# Patient Record
Sex: Female | Born: 1965 | Hispanic: No | Smoking: Former smoker
Health system: Southern US, Community
[De-identification: ages and names within clinical notes are randomized; demographics above are authoritative.]

## PROBLEM LIST (undated history)

## (undated) DIAGNOSIS — F419 Anxiety disorder, unspecified: Secondary | ICD-10-CM

## (undated) DIAGNOSIS — F32A Depression, unspecified: Secondary | ICD-10-CM

## (undated) DIAGNOSIS — F329 Major depressive disorder, single episode, unspecified: Secondary | ICD-10-CM

## (undated) HISTORY — PX: ABDOMINAL SURGERY: SHX537

## (undated) HISTORY — PX: OTHER SURGICAL HISTORY: SHX169

---

## 2009-04-15 ENCOUNTER — Ambulatory Visit (HOSPITAL_COMMUNITY): Admission: RE | Admit: 2009-04-15 | Discharge: 2009-04-15 | Payer: Self-pay | Admitting: Family Medicine

## 2009-11-19 ENCOUNTER — Emergency Department (HOSPITAL_COMMUNITY): Admission: EM | Admit: 2009-11-19 | Discharge: 2009-11-19 | Payer: Self-pay | Admitting: Emergency Medicine

## 2010-11-16 ENCOUNTER — Emergency Department (HOSPITAL_COMMUNITY)
Admission: EM | Admit: 2010-11-16 | Discharge: 2010-11-16 | Disposition: A | Discharge status: Home / Self Care | Payer: Self-pay | Attending: Emergency Medicine | Admitting: Emergency Medicine

## 2010-11-16 DIAGNOSIS — R21 Rash and other nonspecific skin eruption: Secondary | ICD-10-CM | POA: Insufficient documentation

## 2010-11-16 DIAGNOSIS — B850 Pediculosis due to Pediculus humanus capitis: Secondary | ICD-10-CM | POA: Insufficient documentation

## 2010-11-16 HISTORY — DX: Major depressive disorder, single episode, unspecified: F32.9

## 2010-11-16 HISTORY — DX: Depression, unspecified: F32.A

## 2010-11-16 MED ORDER — PREDNISONE 10 MG PO TABS: 10.0000 mg | ORAL_TABLET | Freq: Every day | ORAL | Status: AC

## 2010-11-16 MED ORDER — PERMETHRIN 1 % EX LIQD: Freq: Once | CUTANEOUS | Status: AC

## 2010-11-16 NOTE — ED Notes (Signed)
Pt presents with lice infection and red itching skin. Pt states skin has been going on for "years". Pt states she has performed multiple NIX treatments.

## 2010-11-16 NOTE — ED Notes (Signed)
Pt states youngest daughter began itching a few days ago, has bought several boxes of nix without success.  Mom states followed instructions on box .

## 2010-11-16 NOTE — ED Provider Notes (Signed)
History     CSN: [id number] Arrival date & time: 11/16/2010  2:29 PM  Chief Complaint  Patient presents with  . Rash  . Head Lice   HPI Gina Kline is a 45 y.o. female who presents to the ED with head lice. Onset of symptoms 3 weeks ago when her daughter had a case of lice.   Past Medical History  Diagnosis Date  . Depression     Past Surgical History  Procedure Date  . Tummy tuck     History reviewed. No pertinent family history.  History  Substance Use Topics  . Smoking status: Current Everyday Smoker -- 0.5 packs/day  . Smokeless tobacco: Not on file  . Alcohol Use: Yes    OB History    Grav Para Term Preterm Abortions TAB SAB Ect Mult Living                  Review of Systems  Skin: Positive for rash.  All other systems reviewed and are negative.    Physical Exam  BP 120/77  Pulse 63  Temp 98.5 F (36.9 C)  Resp 20  SpO2 96%  LMP 10/28/2010  Physical Exam  Nursing note and vitals reviewed. Constitutional: She is oriented to person, place, and time. She appears well-developed and well-nourished. No distress.  HENT:  Head: Normocephalic. Hair is abnormal.       Nits noted in hair.  Eyes: EOM are normal.  Neck: Neck supple.  Pulmonary/Chest: Effort normal.  Abdominal: There is no tenderness.  Musculoskeletal: Normal range of motion.  Neurological: She is alert and oriented to person, place, and time. No cranial nerve deficit.  Skin: Skin is warm and dry.    ED Course  Procedures  MDM Assessment: Head lice  Plan:   Nix shampoo   Follow up with PCP    In addition the patient states she has a rash on her arms after working out in the yard the past few days.  The are raised linear areas bilateral forearms. Will treat for contact dermitis. The patient sates that the last time she had this she was treated with a steroid dose pack and it went away quickly. Will give dose pak.   [place], NP 11/16/10 [address] NP 11/16/10  1644

## 2010-11-19 NOTE — ED Provider Notes (Signed)
Medical screening examination/treatment/procedure(s) were performed by non-physician practitioner and as supervising physician I was immediately available for consultation/collaboration.  Nicholes Stairs, MD 11/19/10 [phone]

## 2011-04-13 ENCOUNTER — Encounter (HOSPITAL_COMMUNITY): Payer: Self-pay

## 2011-04-13 ENCOUNTER — Other Ambulatory Visit: Payer: Self-pay

## 2011-04-13 ENCOUNTER — Emergency Department (HOSPITAL_COMMUNITY)
Admission: EM | Admit: 2011-04-13 | Discharge: 2011-04-13 | Disposition: A | Discharge status: Home / Self Care | Payer: Self-pay | Attending: Emergency Medicine | Admitting: Emergency Medicine

## 2011-04-13 DIAGNOSIS — R5383 Other fatigue: Secondary | ICD-10-CM

## 2011-04-13 DIAGNOSIS — R112 Nausea with vomiting, unspecified: Secondary | ICD-10-CM | POA: Insufficient documentation

## 2011-04-13 DIAGNOSIS — R5381 Other malaise: Secondary | ICD-10-CM | POA: Insufficient documentation

## 2011-04-13 DIAGNOSIS — F341 Dysthymic disorder: Secondary | ICD-10-CM | POA: Insufficient documentation

## 2011-04-13 HISTORY — DX: Anxiety disorder, unspecified: F41.9

## 2011-04-13 LAB — URINE MICROSCOPIC-ADD ON

## 2011-04-13 LAB — CBC
MCH: 32.9 pg (ref 26.0–34.0)
MCHC: 33.3 g/dL (ref 30.0–36.0)
Platelets: 306 10*3/uL (ref 150–400)
WBC: 6.4 10*3/uL (ref 4.0–10.5)

## 2011-04-13 LAB — URINALYSIS, ROUTINE W REFLEX MICROSCOPIC
Leukocytes, UA: NEGATIVE
Nitrite: NEGATIVE
Specific Gravity, Urine: >1.03 — ABNORMAL HIGH (ref 1.005–1.030)
Urobilinogen, UA: 0.2 mg/dL (ref 0.0–1.0)

## 2011-04-13 LAB — BASIC METABOLIC PANEL
BUN: 11 mg/dL (ref 6–23)
CO2: 28 mEq/L (ref 19–32)
Chloride: 106 mEq/L (ref 96–112)
Potassium: 3.8 mEq/L (ref 3.5–5.1)

## 2011-04-13 LAB — POCT PREGNANCY, URINE: Preg Test, Ur: NEGATIVE

## 2011-04-13 LAB — DIFFERENTIAL
Eosinophils Absolute: 0.4 10*3/uL (ref 0.0–0.7)
Lymphocytes Relative: 25 % (ref 12–46)
Monocytes Relative: 8 % (ref 3–12)
Neutro Abs: 3.8 10*3/uL (ref 1.7–7.7)
Neutrophils Relative %: 60 % (ref 43–77)

## 2011-04-13 LAB — PREGNANCY, URINE: Preg Test, Ur: NEGATIVE

## 2011-04-13 NOTE — ED Notes (Signed)
General weakness for 3 weeks, has not been seen by md.

## 2011-04-13 NOTE — ED Notes (Signed)
Pt stated she has been having n/v,  "knots swollen to right ear", has now gone away.

## 2011-04-13 NOTE — ED Provider Notes (Signed)
History   This chart was scribed for Joya Gaskins, MD by Sofie Rower. The patient was seen in room APA03/APA03 and the patient's care was started at 6:29PM.    CSN: [id number]  Arrival date & time 04/13/11  1530   First MD Initiated Contact with Patient 04/13/11 1803      Chief Complaint  Patient presents with  . Weakness    x 3 weeks    Patient is a 46 y.o. female presenting with weakness. The history is provided by the patient. No language interpreter was used.  Weakness The primary symptoms include headaches, nausea and vomiting. Primary symptoms do not include loss of consciousness or visual change. The symptoms began more than 1 week ago. Episode duration: constant. The symptoms are unchanged.  The headache began more than 2 days ago. The headache developed gradually. Headache is a new problem. The headache is present frequently. The headache is associated with weakness. The headache is not associated with visual change.  Nausea began more than 1 week ago.  The vomiting began more than 2 days ago.  Additional symptoms include weakness and lower back pain. Additional symptoms do not include pain (chest pain.).    Pt denies dysuria. Pt obtains prescriptions from [facility]. Pt reports mild headache, none at this time No focal weakness Reports fatigue/decreased energy No falls No visual change No cp/sob at this time She has had some vomiting She is currently on menstrual cycle  Past Medical History  Diagnosis Date  . Depression   . Anxiety   . Depression     Past Surgical History  Procedure Date  . Tummy tuck   . Abdominal surgery     No family history on file.  History  Substance Use Topics  . Smoking status: Current Everyday Smoker -- 0.5 packs/day    Types: Cigarettes  . Smokeless tobacco: Not on file  . Alcohol Use: Yes     occ    OB History    Grav Para Term Preterm Abortions TAB SAB Ect Mult Living                  Review of Systems    Gastrointestinal: Positive for nausea and vomiting.  Neurological: Positive for weakness and headaches. Negative for loss of consciousness.  All other systems reviewed and are negative.    Allergies  Review of patient's allergies indicates no known allergies.  Home Medications   Current Outpatient Rx  Name Route Sig Dispense Refill  . CLONAZEPAM 1 MG PO TABS Oral Take 0.5 mg by mouth 2 (two) times daily. Patient also takes 2 tablets by mouth at bedtime    . SERTRALINE HCL 100 MG PO TABS Oral Take 100 mg by mouth 2 (two) times daily.        BP 129/82  Pulse 81  Temp(Src) 97.2 F (36.2 C) (Oral)  Resp 20  Ht 5\' 5"  (1.651 m)  Wt 130 lb (58.968 kg)  BMI 21.63 kg/m2  SpO2 99%  LMP 04/13/2011  Physical Exam  CONSTITUTIONAL: Well developed/well nourished HEAD AND FACE: Normocephalic/atraumatic EYES: EOMI/PERRL, conjunctiva pink ENMT: Mucous membranes moist NECK: supple no meningeal signs SPINE:entire spine nontender CV: S1/S2 noted, no murmurs/rubs/gallops noted LUNGS: Lungs are clear to auscultation bilaterally, no apparent distress ABDOMEN: soft, nontender, no rebound or guarding GU:no cva tenderness NEURO: Pt is awake/alert, moves all extremitiesx4 EXTREMITIES: pulses normal, full ROM SKIN: warm, color normal PSYCH: no abnormalities of mood noted   ED Course  Procedures  DIAGNOSTIC STUDIES: Oxygen Saturation is 99% on room air, normal by my interpretation.    COORDINATION OF CARE:   Results for orders placed during the hospital encounter of 04/13/11  CBC      Component Value Range   WBC 6.4  4.0 - 10.5 (K/uL)   RBC 4.35  3.87 - 5.11 (MIL/uL)   Hemoglobin 14.3  12.0 - 15.0 (g/dL)   HCT 45.4  09.8 - 11.9 (%)   MCV 98.9  78.0 - 100.0 (fL)   MCH 32.9  26.0 - 34.0 (pg)   MCHC 33.3  30.0 - 36.0 (g/dL)   RDW 14.7  82.9 - 56.2 (%)   Platelets 306  150 - 400 (K/uL)  DIFFERENTIAL      Component Value Range   Neutrophils Relative 60  43 - 77 (%)   Neutro Abs  3.8  1.7 - 7.7 (K/uL)   Lymphocytes Relative 25  12 - 46 (%)   Lymphs Abs 1.6  0.7 - 4.0 (K/uL)   Monocytes Relative 8  3 - 12 (%)   Monocytes Absolute 0.5  0.1 - 1.0 (K/uL)   Eosinophils Relative 6 (*) 0 - 5 (%)   Eosinophils Absolute 0.4  0.0 - 0.7 (K/uL)   Basophils Relative 1  0 - 1 (%)   Basophils Absolute 0.1  0.0 - 0.1 (K/uL)  BASIC METABOLIC PANEL      Component Value Range   Sodium 143  135 - 145 (mEq/L)   Potassium 3.8  3.5 - 5.1 (mEq/L)   Chloride 106  96 - 112 (mEq/L)   CO2 28  19 - 32 (mEq/L)   Glucose, Bld 83  70 - 99 (mg/dL)   BUN 11  6 - 23 (mg/dL)   Creatinine, Ser 1.30  0.50 - 1.10 (mg/dL)   Calcium 9.8  8.4 - 86.5 (mg/dL)   GFR calc non Af Amer >90  >90 (mL/min)   GFR calc Af Amer >90  >90 (mL/min)  PREGNANCY, URINE      Component Value Range   Preg Test, Ur NEGATIVE  NEGATIVE   URINALYSIS, ROUTINE W REFLEX MICROSCOPIC      Component Value Range   Color, Urine YELLOW  YELLOW    APPearance CLEAR  CLEAR    Specific Gravity, Urine >1.030 (*) 1.005 - 1.030    pH 6.0  5.0 - 8.0    Glucose, UA NEGATIVE  NEGATIVE (mg/dL)   Hgb urine dipstick LARGE (*) NEGATIVE    Bilirubin Urine NEGATIVE  NEGATIVE    Ketones, ur NEGATIVE  NEGATIVE (mg/dL)   Protein, ur TRACE (*) NEGATIVE (mg/dL)   Urobilinogen, UA 0.2  0.0 - 1.0 (mg/dL)   Nitrite NEGATIVE  NEGATIVE    Leukocytes, UA NEGATIVE  NEGATIVE   POCT PREGNANCY, URINE      Component Value Range   Preg Test, Ur NEGATIVE  NEGATIVE   URINE MICROSCOPIC-ADD ON      Component Value Range   Squamous Epithelial / LPF MANY (*) RARE    WBC, UA 0-2  <3 (WBC/hpf)   RBC / HPF 7-10  <3 (RBC/hpf)   Bacteria, UA FEW (*) RARE      6:31PM- EDP at bedside discusses treatment plan concerning blood work, EKG. Pt well appearing, reports fatigue but no other complaints at this time (no cp/sob/abd pain/headache)  The patient appears reasonably screened and/or stabilized for discharge and I doubt any other medical condition or other [facility]  requiring further screening, evaluation, or treatment  in the ED at this time prior to discharge.   MDM  Nursing notes reviewed and considered in documentation All labs/vitals reviewed and considered    Date: 04/13/2011  Rate: 66  Rhythm: normal sinus rhythm  QRS Axis: normal  Intervals: normal  ST/T Wave abnormalities: nonspecific ST changes  Conduction Disutrbances:none  Narrative Interpretation:   Old EKG Reviewed: none available    I personally performed the services described in this documentation, which was scribed in my presence. The recorded information has been reviewed and considered.        Joya Gaskins, MD 04/13/11 [phone]

## 2011-07-04 ENCOUNTER — Emergency Department (HOSPITAL_COMMUNITY)
Admission: EM | Admit: 2011-07-04 | Discharge: 2011-07-04 | Disposition: A | Discharge status: Home / Self Care | Payer: Self-pay | Attending: Emergency Medicine | Admitting: Emergency Medicine

## 2011-07-04 ENCOUNTER — Encounter (HOSPITAL_COMMUNITY): Payer: Self-pay | Admitting: *Deleted

## 2011-07-04 DIAGNOSIS — M545 Low back pain, unspecified: Secondary | ICD-10-CM | POA: Insufficient documentation

## 2011-07-04 DIAGNOSIS — M5430 Sciatica, unspecified side: Secondary | ICD-10-CM | POA: Insufficient documentation

## 2011-07-04 DIAGNOSIS — M79609 Pain in unspecified limb: Secondary | ICD-10-CM | POA: Insufficient documentation

## 2011-07-04 DIAGNOSIS — Z9889 Other specified postprocedural states: Secondary | ICD-10-CM | POA: Insufficient documentation

## 2011-07-04 DIAGNOSIS — F17200 Nicotine dependence, unspecified, uncomplicated: Secondary | ICD-10-CM | POA: Insufficient documentation

## 2011-07-04 DIAGNOSIS — Z79899 Other long term (current) drug therapy: Secondary | ICD-10-CM | POA: Insufficient documentation

## 2011-07-04 DIAGNOSIS — F341 Dysthymic disorder: Secondary | ICD-10-CM | POA: Insufficient documentation

## 2011-07-04 DIAGNOSIS — M5432 Sciatica, left side: Secondary | ICD-10-CM

## 2011-07-04 MED ORDER — PREDNISONE 50 MG PO TABS: 50.0000 mg | ORAL_TABLET | Freq: Every day | ORAL | Status: AC

## 2011-07-04 MED ORDER — OXYCODONE-ACETAMINOPHEN 5-325 MG PO TABS: ORAL_TABLET | ORAL | Status: DC

## 2011-07-04 MED ORDER — PREDNISONE 20 MG PO TABS
60.0000 mg | ORAL_TABLET | Freq: Once | ORAL | Status: AC
  Administered 2011-07-04: 60 mg via ORAL
  Filled 2011-07-04: qty 3

## 2011-07-04 MED ORDER — OXYCODONE-ACETAMINOPHEN 5-325 MG PO TABS
1.0000 | ORAL_TABLET | Freq: Once | ORAL | Status: AC
  Administered 2011-07-04: 1 via ORAL
  Filled 2011-07-04: qty 1

## 2011-07-04 NOTE — Discharge Instructions (Signed)
Cryotherapy Cryotherapy means treatment with cold. Ice or gel packs can be used to reduce both pain and swelling. Ice is the most helpful within the first 24 to 48 hours after an injury or flareup from overusing a muscle or joint. Sprains, strains, spasms, burning pain, shooting pain, and aches can all be eased with ice. Ice can also be used when recovering from surgery. Ice is effective, has very few side effects, and is safe for most people to use. PRECAUTIONS  Ice is not a safe treatment option for people with:  Raynaud's phenomenon. This is a condition affecting small blood vessels in the extremities. Exposure to cold may cause your problems to return.   Cold hypersensitivity. There are many forms of cold hypersensitivity, including:   Cold urticaria. Red, itchy hives appear on the skin when the tissues begin to warm after being iced.   Cold erythema. This is a red, itchy rash caused by exposure to cold.   Cold hemoglobinuria. Red blood cells break down when the tissues begin to warm after being iced. The hemoglobin that carry oxygen are passed into the urine because they cannot combine with blood proteins fast enough.   Numbness or altered sensitivity in the area being iced.  If you have any of the following conditions, do not use ice until you have discussed cryotherapy with your caregiver:  Heart conditions, such as arrhythmia, angina, or chronic heart disease.   High blood pressure.   Healing wounds or open skin in the area being iced.   Current infections.   Rheumatoid arthritis.   Poor circulation.   Diabetes.  Ice slows the blood flow in the region it is applied. This is beneficial when trying to stop inflamed tissues from spreading irritating chemicals to surrounding tissues. However, if you expose your skin to cold temperatures for too long or without the proper protection, you can damage your skin or nerves. Watch for signs of skin damage due to cold. HOME CARE  INSTRUCTIONS Follow these tips to use ice and cold packs safely.  Place a dry or damp towel between the ice and skin. A damp towel will cool the skin more quickly, so you may need to shorten the time that the ice is used.   For a more rapid response, add gentle compression to the ice.   Ice for no more than 10 to 20 minutes at a time. The bonier the area you are icing, the less time it will take to get the benefits of ice.   Check your skin after 5 minutes to make sure there are no signs of a poor response to cold or skin damage.   Rest 20 minutes or more in between uses.   Once your skin is numb, you can end your treatment. You can test numbness by very lightly touching your skin. The touch should be so light that you do not see the skin dimple from the pressure of your fingertip. When using ice, most people will feel these normal sensations in this order: cold, burning, aching, and numbness.   Do not use ice on someone who cannot communicate their responses to pain, such as small children or people with dementia.  HOW TO MAKE AN ICE PACK Ice packs are the most common way to use ice therapy. Other methods include ice massage, ice baths, and cryo-sprays. Muscle creams that cause a cold, tingly feeling do not offer the same benefits that ice offers and should not be used as a substitute  unless recommended by your caregiver. To make an ice pack, do one of the following:  Place crushed ice or a bag of frozen vegetables in a sealable plastic bag. Squeeze out the excess air. Place this bag inside another plastic bag. Slide the bag into a pillowcase or place a damp towel between your skin and the bag.   Mix 3 parts water with 1 part rubbing alcohol. Freeze the mixture in a sealable plastic bag. When you remove the mixture from the freezer, it will be slushy. Squeeze out the excess air. Place this bag inside another plastic bag. Slide the bag into a pillowcase or place a damp towel between your skin  and the bag.  SEEK MEDICAL CARE IF:  You develop white spots on your skin. This may give the skin a blotchy (mottled) appearance.   Your skin turns blue or pale.   Your skin becomes waxy or hard.   Your swelling gets worse.  MAKE SURE YOU:   Understand these instructions.   Will watch your condition.   Will get help right away if you are not doing well or get worse.  Document Released: 10/20/2010 Document Revised: 02/12/2011 Document Reviewed: 10/20/2010 [facility] Patient Information 2012 [place].Sciatica Sciatica is a weakness and/or changes in sensation (tingling, jolts, hot and cold, numbness) along the path the sciatic nerve travels. Irritation or damage to lumbar nerve roots is often also referred to as lumbar radiculopathy.  Lumbar radiculopathy (Sciatica) is the most common form of this problem. Radiculopathy can occur in any of the nerves coming out of the spinal cord. The problems caused depend on which nerves are involved. The sciatic nerve is the large nerve supplying the branches of nerves going from the hip to the toes. It often causes a numbness or weakness in the skin and/or muscles that the sciatic nerve serves. It also may cause symptoms (problems) of pain, burning, tingling, or electric shock-like feelings in the path of this nerve. This usually comes from injury to the fibers that make up the sciatic nerve. Some of these symptoms are low back pain and/or unpleasant feelings in the following areas:  From the mid-buttock down the back of the leg to the back of the knee.   And/or the outside of the calf and top of the foot.   And/or behind the inner ankle to the sole of the foot.  CAUSES   Herniated or slipped disc. Discs are the little cushions between the bones in the back.   Pressure by the piriformis muscle in the buttock on the sciatic nerve (Piriformis Syndrome).   Misalignment of the bones in the lower back and buttocks (Sacroiliac Joint Derangement).     Narrowing of the spinal canal that puts pressure on or pinches the fibers that make up the sciatic nerve.   A slipped vertebra that is out of line with those above or beneath it.   Abnormality of the nervous system itself so that nerve fibers do not transmit signals properly, especially to feet and calves (neuropathy).   Tumor (this is rare).  Your caregiver can usually determine the cause of your sciatica and begin the treatment most likely to help you. TREATMENT  Taking over-the-counter painkillers, physical therapy, rest, exercise, spinal manipulation, and injections of anesthetics and/or steroids may be used. Surgery, acupuncture, and Yoga can also be effective. Mind over matter techniques, mental imagery, and changing factors such as your bed, chair, desk height, posture, and activities are other treatments that may be  helpful. You and your caregiver can help determine what is best for you. With proper diagnosis, the cause of most sciatica can be identified and removed. Communication and cooperation between your caregiver and you is essential. If you are not successful immediately, do not be discouraged. With time, a proper treatment can be found that will make you comfortable. HOME CARE INSTRUCTIONS   If the pain is coming from a problem in the back, applying ice to that area for 15 to 20 minutes, 3 to 4 times per day while awake, may be helpful. Put the ice in a plastic bag. Place a towel between the bag of ice and your skin.   You may exercise or perform your usual activities if these do not aggravate your pain, or as suggested by your caregiver.   Only take over-the-counter or prescription medicines for pain, discomfort, or fever as directed by your caregiver.   If your caregiver has given you a follow-up appointment, it is very important to keep that appointment. Not keeping the appointment could result in a chronic or permanent injury, pain, and disability. If there is any problem  keeping the appointment, you must call back to this facility for assistance.  SEEK IMMEDIATE MEDICAL CARE IF:   You experience loss of control of bowel or bladder.   You have increasing weakness in the trunk, buttocks, or legs.   There is numbness in any areas from the hip down to the toes.   You have difficulty walking or keeping your balance.   You have any of the above, with fever or forceful vomiting.  Document Released: 02/17/2001 Document Revised: 02/12/2011 Document Reviewed: 10/07/2007 [facility] Patient Information 2012 [place], [place].   Take the meds as directed.  Apply ice several times daily to lower back.  Follow up with your MD at the health dept.  If symptoms don't improve you may need to see an orthopedist or neurosurgeon.

## 2011-07-04 NOTE — ED Notes (Signed)
Left sided back pain shooting down left leg x 2 days.

## 2011-07-04 NOTE — ED Notes (Signed)
Pt a/ox4. Resp even and unlabored. NAD at this time. D/C instructions reviewed with pt. Pt verbalized understanding. Pt ambulated to lobby with steady gate. Pt verbalized understanding regarding no driving for 6 hrs after taking pain medication. Pt states she is having her daughter drive her home. Daughter is with pt.

## 2011-07-04 NOTE — ED Provider Notes (Signed)
History     CSN: [id number]  Arrival date & time 07/04/11  1815   First MD Initiated Contact with Patient 07/04/11 1953      Chief Complaint  Patient presents with  . Back Pain    (Consider location/radiation/quality/duration/timing/severity/associated sxs/prior treatment) HPI Comments: Pt has had intermittent episodes of L sciatica for the past 15 years.  It flared up 3 days ago when she tripped and fell on her R knee.  Radiates to L foot.  Patient is a 46 y.o. female presenting with back pain. The history is provided by the patient. No language interpreter was used.  Back Pain  This is a recurrent problem. The problem occurs constantly. The problem has not changed since onset.The pain is present in the lumbar spine. The quality of the pain is described as shooting. The pain radiates to the left foot. The pain is severe. The symptoms are aggravated by bending and twisting. The pain is the same all the time. Pertinent negatives include no fever, no numbness and no weakness. She has tried nothing for the symptoms.    Past Medical History  Diagnosis Date  . Depression   . Anxiety   . Depression     Past Surgical History  Procedure Date  . Tummy tuck   . Abdominal surgery     No family history on file.  History  Substance Use Topics  . Smoking status: Current Everyday Smoker -- 0.5 packs/day    Types: Cigarettes  . Smokeless tobacco: Not on file  . Alcohol Use: Yes     occ    OB History    Grav Para Term Preterm Abortions TAB SAB Ect Mult Living                  Review of Systems  Constitutional: Negative for fever and chills.  Musculoskeletal: Positive for back pain.  Neurological: Negative for weakness and numbness.  All other systems reviewed and are negative.    Allergies  Review of patient's allergies indicates no known allergies.  Home Medications   Current Outpatient Rx  Name Route Sig Dispense Refill  . CLONAZEPAM 1 MG PO TABS Oral Take 0.5 mg  by mouth 2 (two) times daily. Patient also takes 2 tablets by mouth at bedtime    . OXYCODONE-ACETAMINOPHEN 5-325 MG PO TABS  One tab po q 4-6 hrs prn pain 20 tablet 0  . PREDNISONE 50 MG PO TABS Oral Take 1 tablet (50 mg total) by mouth daily. 6 tablet 0  . SERTRALINE HCL 100 MG PO TABS Oral Take 100 mg by mouth 2 (two) times daily.        BP 117/75  Pulse 80  Temp(Src) 98 F (36.7 C) (Oral)  Resp 16  Ht 5\' 5"  (1.651 m)  Wt 132 lb (59.875 kg)  BMI 21.97 kg/m2  SpO2 98%  LMP 06/27/2011  Physical Exam  Nursing note and vitals reviewed. Constitutional: She is oriented to person, place, and time. She appears well-developed and well-nourished. No distress.  HENT:  Head: Normocephalic and atraumatic.  Eyes: EOM are normal.  Neck: Normal range of motion.  Cardiovascular: Normal rate, regular rhythm and normal heart sounds.   Pulmonary/Chest: Effort normal and breath sounds normal.  Abdominal: Soft. She exhibits no distension. There is no tenderness.  Musculoskeletal: Normal range of motion.       Back:       Localizes pain to L piriformis muscle area and radiation to L heel.  Good B foot dorsiflexion strength.    Neurological: She is alert and oriented to person, place, and time. She displays normal reflexes. Coordination normal.  Skin: Skin is warm and dry.  Psychiatric: She has a normal mood and affect. Judgment normal.    ED Course  Procedures (including critical care time)  Labs Reviewed - No data to display No results found.   1. Left sided sciatica       MDM  rx- prednisone 50 mg, 6 rx-percocet, 20 Ice F/u with your MD.        Worthy Rancher, PA 07/04/11 2014  Worthy Rancher, PA 07/05/11 [phone]

## 2011-07-05 NOTE — ED Provider Notes (Signed)
Medical screening examination/treatment/procedure(s) were performed by non-physician practitioner and as supervising physician I was immediately available for consultation/collaboration.  Nicoletta Dress. Colon Branch, MD 07/05/11 1529

## 2011-12-11 ENCOUNTER — Encounter (HOSPITAL_COMMUNITY): Payer: Self-pay | Admitting: *Deleted

## 2011-12-11 ENCOUNTER — Emergency Department (HOSPITAL_COMMUNITY)
Admission: EM | Admit: 2011-12-11 | Discharge: 2011-12-11 | Disposition: A | Discharge status: Home / Self Care | Payer: Self-pay | Attending: Emergency Medicine | Admitting: Emergency Medicine

## 2011-12-11 DIAGNOSIS — M549 Dorsalgia, unspecified: Secondary | ICD-10-CM

## 2011-12-11 DIAGNOSIS — M5416 Radiculopathy, lumbar region: Secondary | ICD-10-CM

## 2011-12-11 DIAGNOSIS — F17200 Nicotine dependence, unspecified, uncomplicated: Secondary | ICD-10-CM | POA: Insufficient documentation

## 2011-12-11 DIAGNOSIS — M545 Low back pain, unspecified: Secondary | ICD-10-CM | POA: Insufficient documentation

## 2011-12-11 DIAGNOSIS — IMO0002 Reserved for concepts with insufficient information to code with codable children: Secondary | ICD-10-CM | POA: Insufficient documentation

## 2011-12-11 DIAGNOSIS — F329 Major depressive disorder, single episode, unspecified: Secondary | ICD-10-CM | POA: Insufficient documentation

## 2011-12-11 DIAGNOSIS — F411 Generalized anxiety disorder: Secondary | ICD-10-CM | POA: Insufficient documentation

## 2011-12-11 DIAGNOSIS — Z79899 Other long term (current) drug therapy: Secondary | ICD-10-CM | POA: Insufficient documentation

## 2011-12-11 DIAGNOSIS — F3289 Other specified depressive episodes: Secondary | ICD-10-CM | POA: Insufficient documentation

## 2011-12-11 MED ORDER — HYDROCODONE-ACETAMINOPHEN 5-325 MG PO TABS: 1.0000 | ORAL_TABLET | Freq: Four times a day (QID) | ORAL | Status: DC | PRN

## 2011-12-11 MED ORDER — CYCLOBENZAPRINE HCL 10 MG PO TABS
10.0000 mg | ORAL_TABLET | Freq: Once | ORAL | Status: AC
  Administered 2011-12-11: 10 mg via ORAL
  Filled 2011-12-11: qty 1

## 2011-12-11 MED ORDER — PREDNISONE 20 MG PO TABS
60.0000 mg | ORAL_TABLET | Freq: Once | ORAL | Status: AC
  Administered 2011-12-11: 60 mg via ORAL
  Filled 2011-12-11: qty 3

## 2011-12-11 MED ORDER — PREDNISONE 20 MG PO TABS: 40.0000 mg | ORAL_TABLET | Freq: Every day | ORAL | Status: DC

## 2011-12-11 MED ORDER — OXYCODONE-ACETAMINOPHEN 5-325 MG PO TABS: ORAL_TABLET | ORAL | Status: DC

## 2011-12-11 MED ORDER — CYCLOBENZAPRINE HCL 10 MG PO TABS: ORAL_TABLET | ORAL | Status: DC

## 2011-12-11 NOTE — ED Provider Notes (Addendum)
History     CSN: [id number]  Arrival date & time 12/11/11  1846   First MD Initiated Contact with Patient 12/11/11 1924      Chief Complaint  Patient presents with  . Back Pain    (Consider location/radiation/quality/duration/timing/severity/associated sxs/prior treatment) HPI Comments: Pt c/o L sciatic nerve pain ~ 2 days.  No known injury  The history is provided by the patient. No language interpreter was used.    Past Medical History  Diagnosis Date  . Depression   . Anxiety   . Depression     Past Surgical History  Procedure Date  . Tummy tuck   . Abdominal surgery     History reviewed. No pertinent family history.  History  Substance Use Topics  . Smoking status: Current Every Day Smoker -- 0.5 packs/day    Types: Cigarettes  . Smokeless tobacco: Not on file  . Alcohol Use: Yes     occ    OB History    Grav Para Term Preterm Abortions TAB SAB Ect Mult Living                  Review of Systems  Musculoskeletal: Positive for back pain.  Neurological: Negative for weakness and numbness.  All other systems reviewed and are negative.    Allergies  Review of patient's allergies indicates no known allergies.  Home Medications   Current Outpatient Rx  Name Route Sig Dispense Refill  . CLONAZEPAM 1 MG PO TABS Oral Take 0.5 mg by mouth 2 (two) times daily. Patient also takes 2 tablets by mouth at bedtime    . CYCLOBENZAPRINE HCL 10 MG PO TABS  1/2 to one tab po TID 20 tablet 0  . HYDROCODONE-ACETAMINOPHEN 5-325 MG PO TABS Oral Take 1 tablet by mouth every 6 (six) hours as needed for pain. 20 tablet 0  . OXYCODONE-ACETAMINOPHEN 5-325 MG PO TABS  One tab po q 4-6 hrs prn pain 20 tablet 0  . PREDNISONE 20 MG PO TABS Oral Take 2 tablets (40 mg total) by mouth daily. 10 tablet 0  . SERTRALINE HCL 100 MG PO TABS Oral Take 100 mg by mouth 2 (two) times daily.        BP 129/84  Pulse 85  Temp 98 F (36.7 C) (Oral)  Resp 20  Ht 5\' 1"  (1.549 m)  Wt 125  lb (56.7 kg)  BMI 23.62 kg/m2  SpO2 100%  LMP 12/06/2011  Physical Exam  Nursing note and vitals reviewed. Constitutional: She is oriented to person, place, and time. She appears well-developed and well-nourished. No distress.  HENT:  Head: Normocephalic and atraumatic.  Eyes: EOM are normal.  Neck: Normal range of motion.  Cardiovascular: Normal rate and regular rhythm.   Pulmonary/Chest: Effort normal.  Abdominal: Soft. She exhibits no distension. There is no tenderness.  Musculoskeletal: She exhibits tenderness.       Lumbar back: She exhibits decreased range of motion, tenderness and pain. She exhibits no bony tenderness.       Back:  Neurological: She is alert and oriented to person, place, and time.  Skin: Skin is warm and dry.  Psychiatric: She has a normal mood and affect. Judgment normal.    ED Course  Procedures (including critical care time)  Labs Reviewed - No data to display No results found.   1. Back pain   2. Lumbar radiculopathy, acute       MDM  No saddle dysthesia or incontinence  rx-hydrocodone, 20  rx-flexeril, 20 rx- prednisone 40 mg x 5 days Ice F/u with dr. Hilda Lias.        Evalina Field, PA 12/11/11 1942  Evalina Field, PA 01/11/12 1832  Evalina Field, PA 01/19/12 [phone]

## 2011-12-11 NOTE — ED Notes (Signed)
Lt "sciatic nerve " pain, no injury.

## 2011-12-11 NOTE — ED Notes (Signed)
Pt with lower back pain, hx of same, pt restless in room, states that she can't stand or sit for long periods

## 2011-12-12 NOTE — ED Provider Notes (Signed)
Medical screening examination/treatment/procedure(s) were performed by non-physician practitioner and as supervising physician I was immediately available for consultation/collaboration.  Geoffery Lyons, MD 12/12/11 [phone]

## 2012-01-13 NOTE — ED Provider Notes (Signed)
Medical screening examination/treatment/procedure(s) were performed by non-physician practitioner and as supervising physician I was immediately available for consultation/collaboration.  Nazareth Kirk, MD 01/13/12 0744 

## 2012-01-19 NOTE — ED Provider Notes (Signed)
Medical screening examination/treatment/procedure(s) were performed by non-physician practitioner and as supervising physician I was immediately available for consultation/collaboration.  Kort Stettler, MD 01/19/12 1852 

## 2012-06-17 ENCOUNTER — Encounter (HOSPITAL_COMMUNITY): Payer: Self-pay | Admitting: Emergency Medicine

## 2012-06-17 ENCOUNTER — Other Ambulatory Visit: Payer: Self-pay

## 2012-06-17 DIAGNOSIS — Z79899 Other long term (current) drug therapy: Secondary | ICD-10-CM | POA: Insufficient documentation

## 2012-06-17 DIAGNOSIS — F411 Generalized anxiety disorder: Secondary | ICD-10-CM | POA: Insufficient documentation

## 2012-06-17 DIAGNOSIS — F329 Major depressive disorder, single episode, unspecified: Secondary | ICD-10-CM | POA: Insufficient documentation

## 2012-06-17 DIAGNOSIS — IMO0002 Reserved for concepts with insufficient information to code with codable children: Secondary | ICD-10-CM | POA: Insufficient documentation

## 2012-06-17 DIAGNOSIS — F17200 Nicotine dependence, unspecified, uncomplicated: Secondary | ICD-10-CM | POA: Insufficient documentation

## 2012-06-17 DIAGNOSIS — I10 Essential (primary) hypertension: Secondary | ICD-10-CM | POA: Insufficient documentation

## 2012-06-17 DIAGNOSIS — R002 Palpitations: Secondary | ICD-10-CM | POA: Insufficient documentation

## 2012-06-17 DIAGNOSIS — F1410 Cocaine abuse, uncomplicated: Secondary | ICD-10-CM | POA: Insufficient documentation

## 2012-06-17 DIAGNOSIS — F3289 Other specified depressive episodes: Secondary | ICD-10-CM | POA: Insufficient documentation

## 2012-06-17 NOTE — ED Notes (Signed)
Patient complaining of "heart racing and high blood pressure." Admits it started tonight after doing cocaine. Also complaining of headache.

## 2012-06-18 ENCOUNTER — Emergency Department (HOSPITAL_COMMUNITY)
Admission: EM | Admit: 2012-06-18 | Discharge: 2012-06-18 | Disposition: A | Discharge status: Home / Self Care | Payer: BC Managed Care – PPO | Attending: Emergency Medicine | Admitting: Emergency Medicine

## 2012-06-18 DIAGNOSIS — R002 Palpitations: Secondary | ICD-10-CM

## 2012-06-18 DIAGNOSIS — F1410 Cocaine abuse, uncomplicated: Secondary | ICD-10-CM

## 2012-06-18 LAB — CBC WITH DIFFERENTIAL/PLATELET
Basophils Absolute: 0.1 10*3/uL (ref 0.0–0.1)
Eosinophils Relative: 1 % (ref 0–5)
Lymphocytes Relative: 27 % (ref 12–46)
MCH: 33.3 pg (ref 26.0–34.0)
MCHC: 34.1 g/dL (ref 30.0–36.0)
MCV: 97.7 fL (ref 78.0–100.0)
Monocytes Absolute: 0.3 10*3/uL (ref 0.1–1.0)
Monocytes Relative: 5 % (ref 3–12)
Platelets: 316 10*3/uL (ref 150–400)
RDW: 12.4 % (ref 11.5–15.5)

## 2012-06-18 LAB — RAPID URINE DRUG SCREEN, HOSP PERFORMED
Barbiturates: NOT DETECTED
Cocaine: POSITIVE — AB

## 2012-06-18 LAB — COMPREHENSIVE METABOLIC PANEL
Albumin: 4.7 g/dL (ref 3.5–5.2)
BUN: 10 mg/dL (ref 6–23)
CO2: 27 mEq/L (ref 19–32)
Chloride: 101 mEq/L (ref 96–112)
GFR calc Af Amer: >90 mL/min (ref >90)
GFR calc non Af Amer: >90 mL/min (ref >90)
Potassium: 4.3 mEq/L (ref 3.5–5.1)
Sodium: 138 mEq/L (ref 135–145)

## 2012-06-18 NOTE — ED Provider Notes (Signed)
History     CSN: [id number]  Arrival date & time 06/17/12  2337   First MD Initiated Contact with Patient 06/18/12 0011      Chief Complaint  Patient presents with  . Tachycardia  . Hypertension    (Consider location/radiation/quality/duration/timing/severity/associated sxs/prior treatment) HPI Comments: Patient presents with elevated bp and racing heart after snorting cocaine at about 8PM tonight.  She feels as though she is going to have a stroke.  She denies chest pain or shortness of breath.  No fevers or chills.    Patient is a 47 y.o. female presenting with hypertension. The history is provided by the patient.  Hypertension This is a new problem. The current episode started 1 to 2 hours ago. The problem occurs constantly. The problem has not changed since onset.Pertinent negatives include no chest pain, no headaches and no shortness of breath. Nothing aggravates the symptoms. Nothing relieves the symptoms. She has tried nothing for the symptoms.    Past Medical History  Diagnosis Date  . Depression   . Anxiety   . Depression     Past Surgical History  Procedure Laterality Date  . Tummy tuck    . Abdominal surgery      History reviewed. No pertinent family history.  History  Substance Use Topics  . Smoking status: Current Every Day Smoker -- 0.50 packs/day    Types: Cigarettes  . Smokeless tobacco: Not on file  . Alcohol Use: Yes     Comment: occ    OB History   Grav Para Term Preterm Abortions TAB SAB Ect Mult Living                  Review of Systems  Respiratory: Negative for shortness of breath.   Cardiovascular: Negative for chest pain.  Neurological: Negative for headaches.  All other systems reviewed and are negative.    Allergies  Vicodin  Home Medications   Current Outpatient Rx  Name  Route  Sig  Dispense  Refill  . gabapentin (NEURONTIN) 300 MG capsule   Oral   Take 300 mg by mouth 2 (two) times daily.         . clonazePAM  (KLONOPIN) 1 MG tablet   Oral   Take 0.5 mg by mouth 2 (two) times daily. Patient also takes 2 tablets by mouth at bedtime         . cyclobenzaprine (FLEXERIL) 10 MG tablet      1/2 to one tab po TID   20 tablet   0   . oxyCODONE-acetaminophen (PERCOCET) 5-325 MG per tablet      One tab po q 4-6 hrs prn pain   20 tablet   0   . oxyCODONE-acetaminophen (PERCOCET/ROXICET) 5-325 MG per tablet      One tab po q 6 hrs prn pain   20 tablet   0   . predniSONE (DELTASONE) 20 MG tablet   Oral   Take 2 tablets (40 mg total) by mouth daily.   10 tablet   0   . sertraline (ZOLOFT) 100 MG tablet   Oral   Take 100 mg by mouth 2 (two) times daily.             BP 149/105  Pulse 108  Temp(Src) 98.6 F (37 C) (Oral)  Resp 18  Ht 5\' 5"  (1.651 m)  Wt 125 lb (56.7 kg)  BMI 20.8 kg/m2  SpO2 99%  LMP 06/03/2012  Physical Exam  Nursing note  and vitals reviewed. Constitutional: She is oriented to person, place, and time. She appears well-developed and well-nourished. No distress.  HENT:  Head: Normocephalic and atraumatic.  Eyes: EOM are normal. Pupils are equal, round, and reactive to light.  Neck: Normal range of motion. Neck supple.  Cardiovascular: Normal rate and regular rhythm.  Exam reveals no gallop and no friction rub.   No murmur heard. Pulmonary/Chest: Effort normal and breath sounds normal. No respiratory distress. She has no wheezes.  Abdominal: Soft. Bowel sounds are normal. She exhibits no distension. There is no tenderness.  Musculoskeletal: Normal range of motion.  Neurological: She is alert and oriented to person, place, and time. No cranial nerve deficit. She exhibits normal muscle tone. Coordination normal.  Skin: Skin is warm and dry. She is not diaphoretic.    ED Course  Procedures (including critical care time)  Labs Reviewed - No data to display No results found.   No diagnosis found.   Date: 06/18/2012  Rate: 107  Rhythm: sinus tachycardia   QRS Axis: normal  Intervals: normal  ST/T Wave abnormalities: normal  Conduction Disutrbances:none  Narrative Interpretation:   Old EKG Reviewed: none available    MDM  Labs, ekg all appear unremarkable.  She never really complained of chest pain and troponin was negative.  I feel as though she is appropriate for discharge.   Patient advised to refrain from cocaine use in the future.  To follow up prn.        Sudie Grumbling, MD 06/18/12 0500

## 2012-06-18 NOTE — ED Notes (Signed)
Pt alert & oriented x4, stable gait. Patient given discharge instructions, paperwork & prescription(s). Patient  instructed to stop at the registration desk to finish any additional paperwork. Patient verbalized understanding. Pt left department w/ no further questions. 

## 2012-06-18 NOTE — ED Notes (Signed)
Pt admits to snorting cocaine tonight & began feeling like her heart was racing.

## 2012-07-11 ENCOUNTER — Emergency Department (HOSPITAL_COMMUNITY): Payer: BC Managed Care – PPO

## 2012-07-11 ENCOUNTER — Encounter (HOSPITAL_COMMUNITY): Payer: Self-pay | Admitting: *Deleted

## 2012-07-11 ENCOUNTER — Emergency Department (HOSPITAL_COMMUNITY)
Admission: EM | Admit: 2012-07-11 | Discharge: 2012-07-11 | Discharge status: Against Medical Advice | Payer: BC Managed Care – PPO | Attending: Emergency Medicine | Admitting: Emergency Medicine

## 2012-07-11 DIAGNOSIS — F17200 Nicotine dependence, unspecified, uncomplicated: Secondary | ICD-10-CM | POA: Insufficient documentation

## 2012-07-11 DIAGNOSIS — R0602 Shortness of breath: Secondary | ICD-10-CM | POA: Insufficient documentation

## 2012-07-11 DIAGNOSIS — F411 Generalized anxiety disorder: Secondary | ICD-10-CM | POA: Insufficient documentation

## 2012-07-11 NOTE — ED Notes (Signed)
Patient not in waiting room for room placement. 

## 2012-07-11 NOTE — ED Notes (Signed)
"  anxiety attack" and feels sob.  X 1 hour

## 2015-03-10 DIAGNOSIS — I639 Cerebral infarction, unspecified: Secondary | ICD-10-CM

## 2015-03-10 HISTORY — DX: Cerebral infarction, unspecified: I63.9

## 2015-08-28 ENCOUNTER — Emergency Department (HOSPITAL_COMMUNITY)
Admission: EM | Admit: 2015-08-28 | Discharge: 2015-08-28 | Disposition: A | Discharge status: Home / Self Care | Payer: Self-pay | Attending: Emergency Medicine | Admitting: Emergency Medicine

## 2015-08-28 ENCOUNTER — Encounter (HOSPITAL_COMMUNITY): Payer: Self-pay

## 2015-08-28 DIAGNOSIS — Z87891 Personal history of nicotine dependence: Secondary | ICD-10-CM | POA: Insufficient documentation

## 2015-08-28 DIAGNOSIS — L309 Dermatitis, unspecified: Secondary | ICD-10-CM | POA: Insufficient documentation

## 2015-08-28 DIAGNOSIS — F329 Major depressive disorder, single episode, unspecified: Secondary | ICD-10-CM | POA: Insufficient documentation

## 2015-08-28 MED ORDER — DEXAMETHASONE SODIUM PHOSPHATE 4 MG/ML IJ SOLN
10.0000 mg | Freq: Once | INTRAMUSCULAR | Status: AC
  Administered 2015-08-28: 10 mg via INTRAMUSCULAR
  Filled 2015-08-28: qty 3

## 2015-08-28 MED ORDER — TRIAMCINOLONE ACETONIDE 0.1 % EX CREA: TOPICAL_CREAM | CUTANEOUS | Status: DC

## 2015-08-28 NOTE — ED Notes (Addendum)
Pt reports that her eczema on her Right upper arm flared  approx one month ago and has progressively worsening, moving down her arm Right arm is painful with patchy red areas and pustule areas on lower arm

## 2015-08-28 NOTE — ED Provider Notes (Signed)
CSN: [id number]     Arrival date & time 08/28/15  [id number] History   First MD Initiated Contact with Patient 08/28/15 [phone]     Chief Complaint  Patient presents with  . Eczema     (Consider location/radiation/quality/duration/timing/severity/associated sxs/prior Treatment) HPI   Gina Kline is a 50 y.o. female who presents to the Emergency Department complaining of recurrent eczema flare to her right upper arm.  She reports having eczema for years with sx's localized to the right arm.  Sx's have been worsening for one month and now progressing below her elbow.  She has tried her usual treatment therapies without relief.  She denies pain, swelling, numbness or weakness of the extremity.     Past Medical History  Diagnosis Date  . Depression   . Anxiety   . Depression    Past Surgical History  Procedure Laterality Date  . Tummy tuck    . Abdominal surgery     No family history on file. Social History  Substance Use Topics  . Smoking status: Former Smoker -- 0.00 packs/day  . Smokeless tobacco: None  . Alcohol Use: No   OB History    No data available     Review of Systems  Constitutional: Negative for fever, chills, activity change and appetite change.  HENT: Negative for facial swelling, sore throat and trouble swallowing.   Respiratory: Negative for chest tightness, shortness of breath and wheezing.   Musculoskeletal: Negative for neck pain and neck stiffness.  Skin: Positive for rash. Negative for wound.  Neurological: Negative for dizziness, weakness, numbness and headaches.  All other systems reviewed and are negative.     Allergies  Vicodin  Home Medications   Prior to Admission medications   Medication Sig Start Date End Date Taking? Authorizing Provider  clonazePAM (KLONOPIN) 1 MG tablet Take 0.5 mg by mouth 2 (two) times daily. Patient also takes 2 tablets by mouth at bedtime    Historical Provider, MD  cyclobenzaprine (FLEXERIL) 10 MG tablet 1/2 to  one tab po TID 12/11/11   Worthy Rancherichard M Miller, PA-C  gabapentin (NEURONTIN) 300 MG capsule Take 300 mg by mouth 2 (two) times daily.    Historical Provider, MD  oxyCODONE-acetaminophen (PERCOCET) 5-325 MG per tablet One tab po q 4-6 hrs prn pain 07/04/11   Worthy Rancherichard M Miller, PA-C  oxyCODONE-acetaminophen (PERCOCET/ROXICET) 5-325 MG per tablet One tab po q 6 hrs prn pain 12/11/11   Worthy Rancherichard M Miller, PA-C  predniSONE (DELTASONE) 20 MG tablet Take 2 tablets (40 mg total) by mouth daily. 12/11/11   Richard Paul HalfM Miller, PA-C  sertraline (ZOLOFT) 100 MG tablet Take 100 mg by mouth 2 (two) times daily.      Historical Provider, MD   BP 136/99 mmHg  Pulse 78  Temp(Src) 98 F (36.7 C) (Oral)  Resp 16  Ht 5\' 5"  (1.651 m)  Wt 62.687 kg  BMI 23.00 kg/m2  SpO2 98%  LMP 05/08/2015 Physical Exam  Constitutional: She is oriented to person, place, and time. She appears well-developed and well-nourished. No distress.  HENT:  Head: Normocephalic and atraumatic.  Mouth/Throat: Oropharynx is clear and moist.  Neck: Normal range of motion. Neck supple.  Cardiovascular: Normal rate, regular rhythm, normal heart sounds and intact distal pulses.   No murmur heard. Pulmonary/Chest: Effort normal and breath sounds normal. No respiratory distress.  Musculoskeletal: Normal range of motion. She exhibits no edema or tenderness.  Lymphadenopathy:    She has no cervical adenopathy.  Neurological: She  is alert and oriented to person, place, and time. She exhibits normal muscle tone. Coordination normal.  Skin: Skin is warm. Rash noted. There is erythema.  Erythematous macular, scaly area to the right upper arm.  Well defined margins.  No excessive warmth or edema. Sensation intact  Psychiatric: She has a normal mood and affect.  Nursing note and vitals reviewed.   ED Course  Procedures (including critical care time) Labs Review Labs Reviewed - No data to display  Imaging Review No results found. I have personally  reviewed and evaluated these images and lab results as part of my medical decision-making.   EKG Interpretation None      MDM   Final diagnoses:  Eczema   Patient well-appearing. Vital signs are stable. Localized, rash to right upper arm with history of recurrent eczema.NV intact.  IM Decadron given, prescription for triamcinolone cream patient agrees to take over-the-counter Benadryl as needed for itching, referral to dermatology if needed.     Pauline Aus, PA-C 08/30/15 1334  Loren Racer, MD 08/31/15 [phone]

## 2015-11-09 ENCOUNTER — Encounter (HOSPITAL_COMMUNITY): Payer: Self-pay | Admitting: *Deleted

## 2015-11-09 ENCOUNTER — Emergency Department (HOSPITAL_COMMUNITY): Payer: Self-pay

## 2015-11-09 ENCOUNTER — Observation Stay (HOSPITAL_COMMUNITY)
Admission: EM | Admit: 2015-11-09 | Discharge: 2015-11-10 | Disposition: A | Discharge status: Home / Self Care | Payer: Self-pay | Attending: Family Medicine | Admitting: Family Medicine

## 2015-11-09 DIAGNOSIS — R29898 Other symptoms and signs involving the musculoskeletal system: Secondary | ICD-10-CM | POA: Diagnosis present

## 2015-11-09 DIAGNOSIS — G459 Transient cerebral ischemic attack, unspecified: Principal | ICD-10-CM | POA: Insufficient documentation

## 2015-11-09 DIAGNOSIS — Z79899 Other long term (current) drug therapy: Secondary | ICD-10-CM | POA: Insufficient documentation

## 2015-11-09 DIAGNOSIS — R4701 Aphasia: Secondary | ICD-10-CM

## 2015-11-09 DIAGNOSIS — Z87891 Personal history of nicotine dependence: Secondary | ICD-10-CM | POA: Insufficient documentation

## 2015-11-09 DIAGNOSIS — I10 Essential (primary) hypertension: Secondary | ICD-10-CM | POA: Insufficient documentation

## 2015-11-09 DIAGNOSIS — Z7982 Long term (current) use of aspirin: Secondary | ICD-10-CM | POA: Insufficient documentation

## 2015-11-09 LAB — CBC
HEMATOCRIT: 40.8 % (ref 36.0–46.0)
HEMOGLOBIN: 13.8 g/dL (ref 12.0–15.0)
MCH: 31.4 pg (ref 26.0–34.0)
MCHC: 33.8 g/dL (ref 30.0–36.0)
MCV: 92.7 fL (ref 78.0–100.0)
Platelets: 291 10*3/uL (ref 150–400)
RBC: 4.4 MIL/uL (ref 3.87–5.11)
RDW: 12.2 % (ref 11.5–15.5)
WBC: 6.5 10*3/uL (ref 4.0–10.5)

## 2015-11-09 LAB — URINALYSIS, ROUTINE W REFLEX MICROSCOPIC
Bilirubin Urine: NEGATIVE
GLUCOSE, UA: NEGATIVE mg/dL
Ketones, ur: 15 mg/dL — AB
Nitrite: NEGATIVE
PH: 6 (ref 5.0–8.0)
Protein, ur: NEGATIVE mg/dL
SPECIFIC GRAVITY, URINE: 1.015 (ref 1.005–1.030)

## 2015-11-09 LAB — TROPONIN I: Troponin I: <0.03 ng/mL (ref <0.03)

## 2015-11-09 LAB — COMPREHENSIVE METABOLIC PANEL
ALBUMIN: 4.7 g/dL (ref 3.5–5.0)
ALK PHOS: 62 U/L (ref 38–126)
ALT: 15 U/L (ref 14–54)
AST: 21 U/L (ref 15–41)
Anion gap: 3 — ABNORMAL LOW (ref 5–15)
BILIRUBIN TOTAL: 0.5 mg/dL (ref 0.3–1.2)
BUN: 16 mg/dL (ref 6–20)
CALCIUM: 8.9 mg/dL (ref 8.9–10.3)
CO2: 27 mmol/L (ref 22–32)
CREATININE: 0.64 mg/dL (ref 0.44–1.00)
Chloride: 107 mmol/L (ref 101–111)
GFR calc Af Amer: >60 mL/min (ref >60)
GFR calc non Af Amer: >60 mL/min (ref >60)
GLUCOSE: 101 mg/dL — AB (ref 65–99)
Potassium: 3.7 mmol/L (ref 3.5–5.1)
SODIUM: 137 mmol/L (ref 135–145)
Total Protein: 7.6 g/dL (ref 6.5–8.1)

## 2015-11-09 LAB — URINE MICROSCOPIC-ADD ON

## 2015-11-09 LAB — RAPID URINE DRUG SCREEN, HOSP PERFORMED
Amphetamines: NOT DETECTED
Barbiturates: NOT DETECTED
Benzodiazepines: NOT DETECTED
Cocaine: NOT DETECTED
OPIATES: NOT DETECTED
TETRAHYDROCANNABINOL: NOT DETECTED

## 2015-11-09 LAB — POC URINE PREG, ED: Preg Test, Ur: NEGATIVE

## 2015-11-09 LAB — ETHANOL: Alcohol, Ethyl (B): <5 mg/dL (ref <5)

## 2015-11-09 IMAGING — CT CT HEAD W/O CM
3 of 4 series · 16 of 47 positions shown, 19 images · non-contrast
Comparison: None.

CLINICAL DATA: 50-year-old female with acute aphasia and right arm
numbness.

EXAM:
CT HEAD WITHOUT CONTRAST
TECHNIQUE: Contiguous axial images were obtained from the base of the skull
through the vertex without intravenous contrast.

[Series 2: head w/o · axial · non-contrast · 0.40mm/px · z∈[+79,+207]mm · 10 of 38 slices shown, 13 images]
[im 3/38  brain]
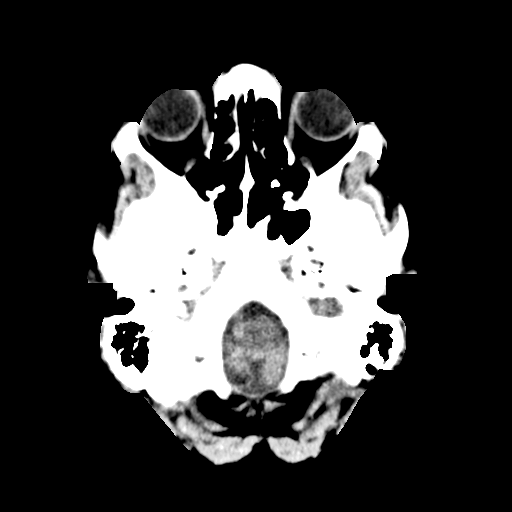
[im 3/38  bone]
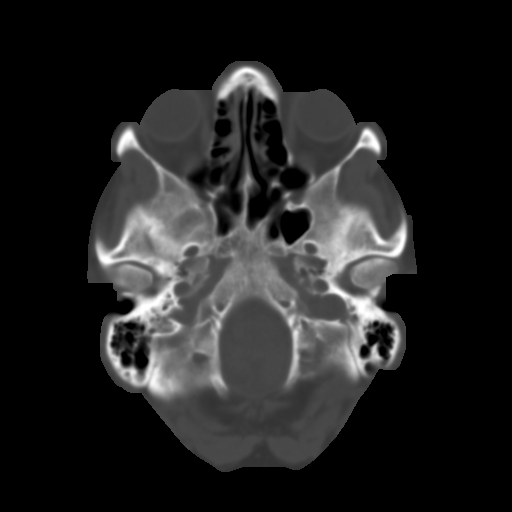
[im 6/38  brain]
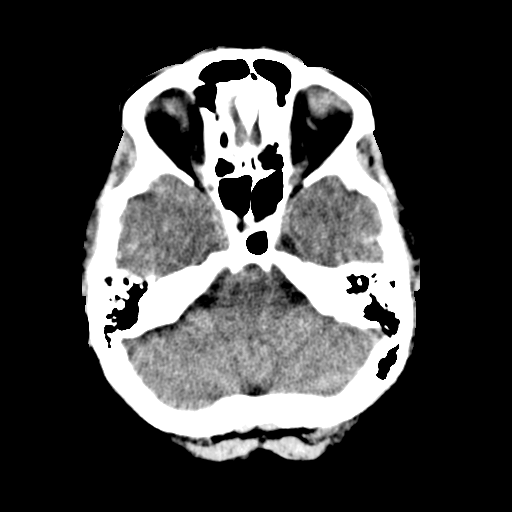
[im 11/38  brain]
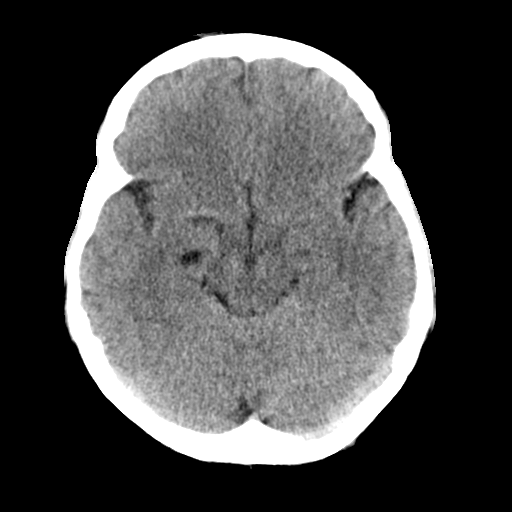
[im 14/38  brain]
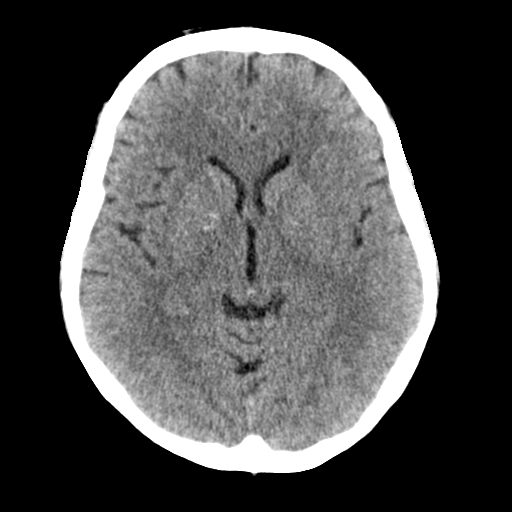
[im 16/38  brain]
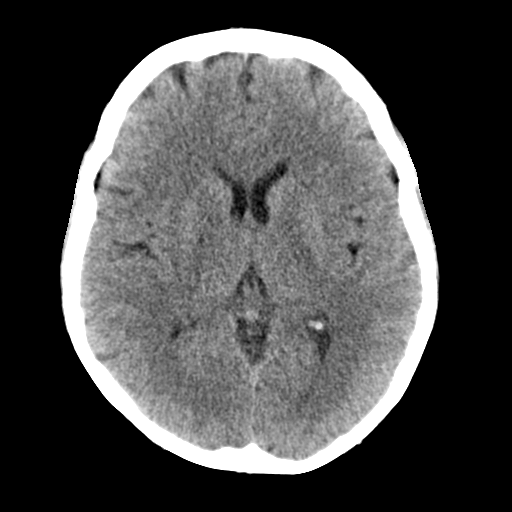
[im 16/38  bone]
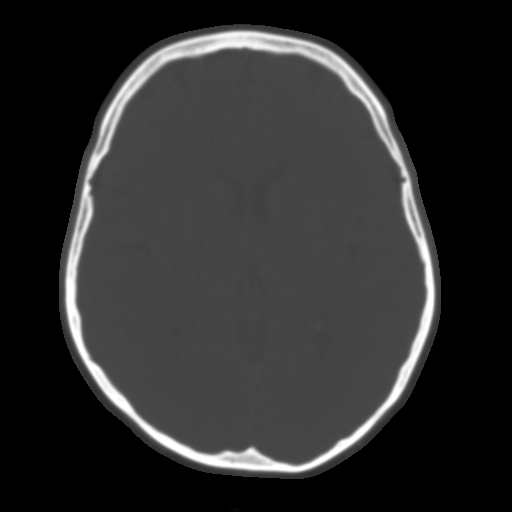
[im 22/38  brain]
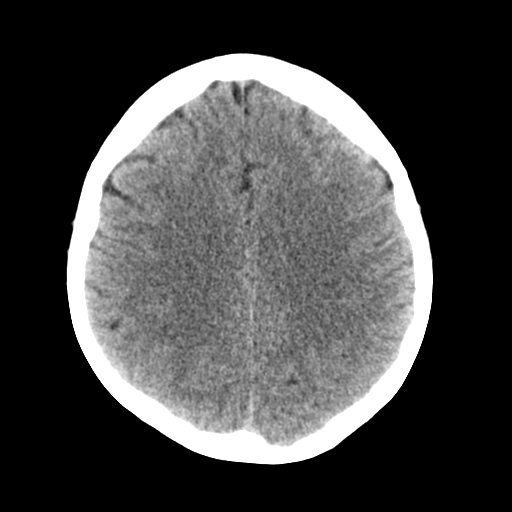
[im 24/38  brain]
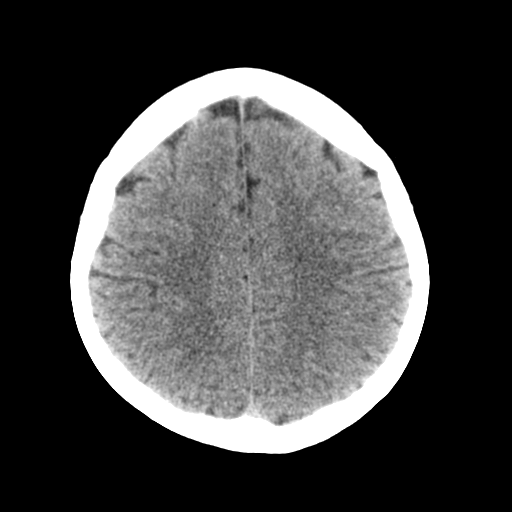
[im 27/38  brain]
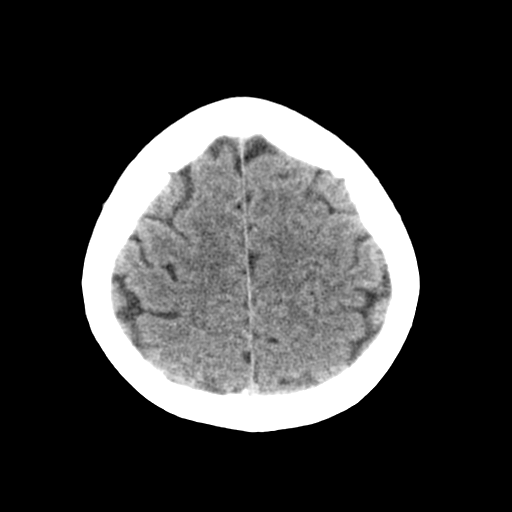
[im 32/38  brain]
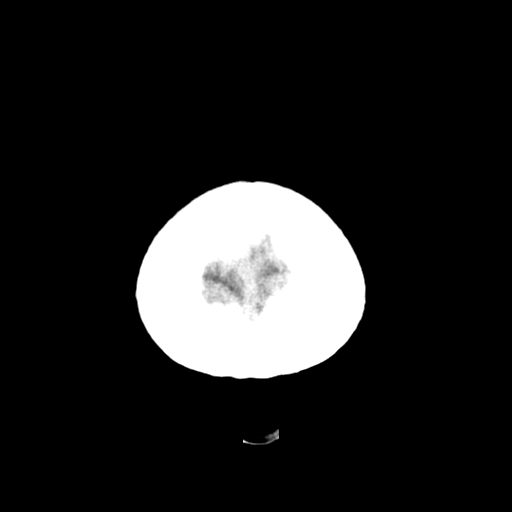
[im 32/38  bone]
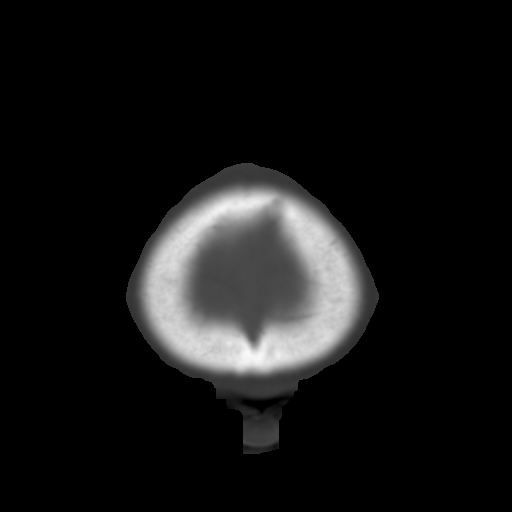
[im 35/38  brain]
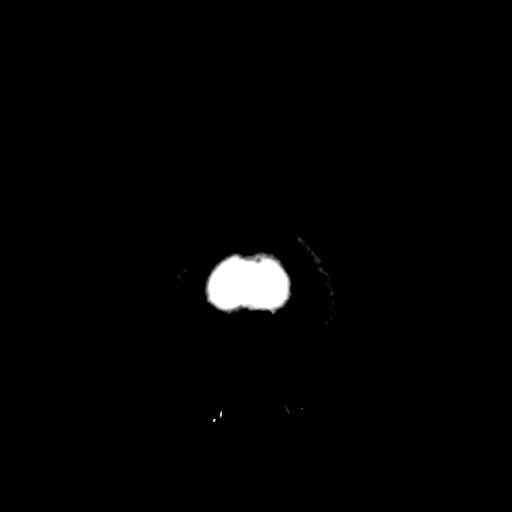

[Series 4: coronal · coronal · 0.28mm/px · 3 of 60 slices shown]
[im 20/60  brain]
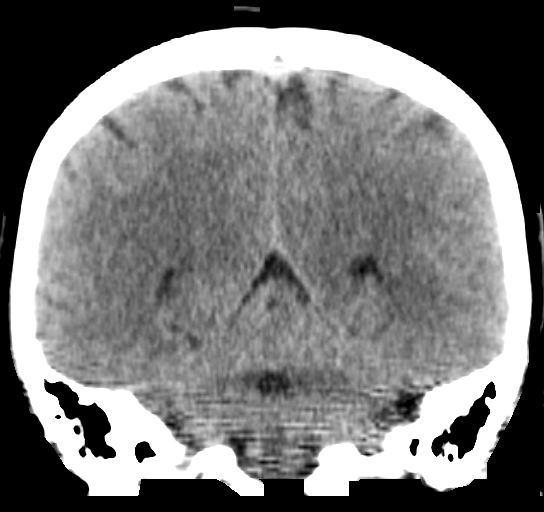
[im 27/60  brain]
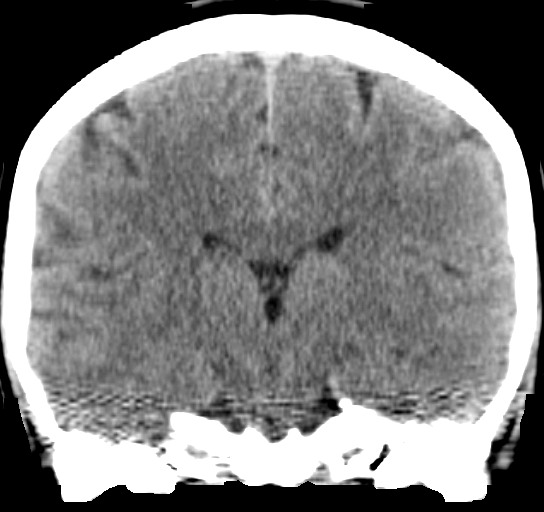
[im 33/60  brain]
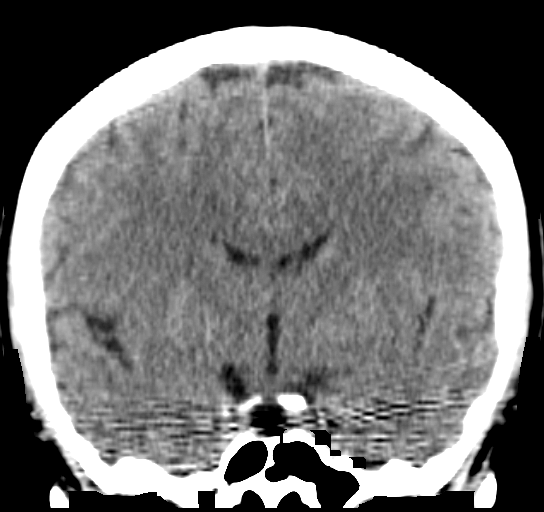

[Series 5: sagittal · sagittal · 0.29mm/px · 3 of 51 slices shown]
[im 17/51  brain]
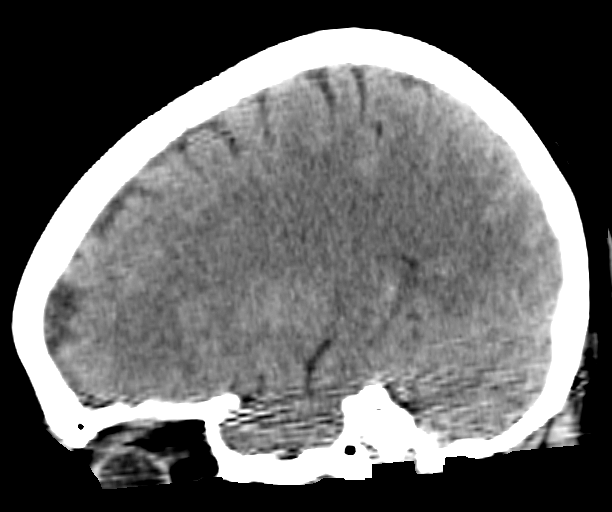
[im 26/51  brain]
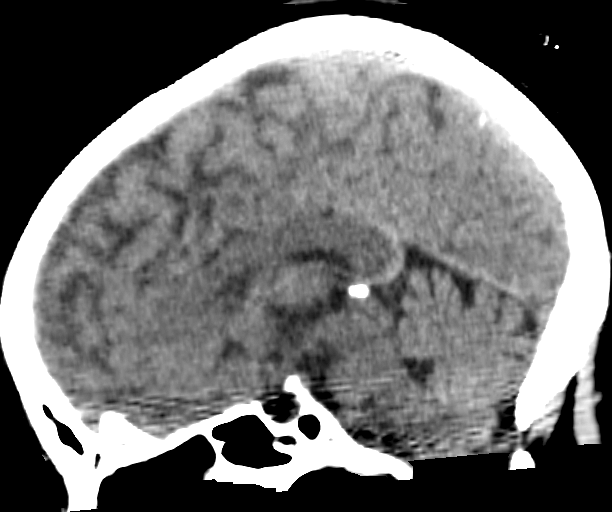
[im 34/51  brain]
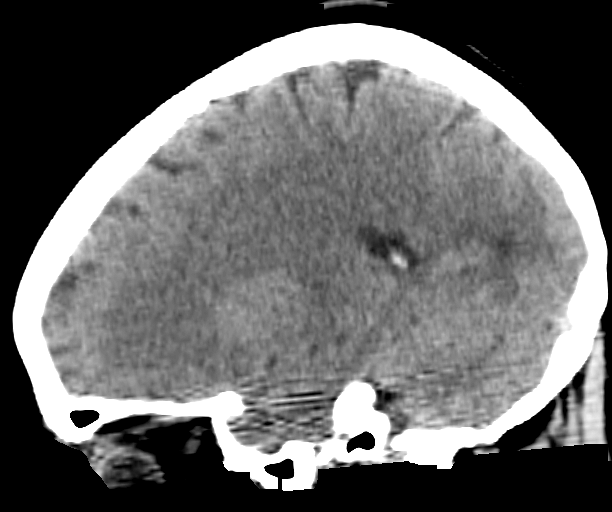

[16 of 47 positions shown; findings below may reference images not displayed]

FINDINGS: Brain: No evidence of infarction, hemorrhage, hydrocephalus,
extra-axial collection or mass lesion/mass effect.

Vascular: No hyperdense vessel or unexpected calcification.

Skull: Unremarkable

Sinuses/Orbits: Visualized portions unremarkable

Other: None.
IMPRESSION: Normal noncontrast head CT.

## 2015-11-09 MED ORDER — METOPROLOL TARTRATE 5 MG/5ML IV SOLN
5.0000 mg | Freq: Once | INTRAVENOUS | Status: AC
  Administered 2015-11-09: 5 mg via INTRAVENOUS
  Filled 2015-11-09: qty 5

## 2015-11-09 MED ORDER — IOPAMIDOL (ISOVUE-370) INJECTION 76%: 75.0000 mL | Freq: Once | INTRAVENOUS | Status: DC | PRN

## 2015-11-09 MED ORDER — CLOPIDOGREL BISULFATE 75 MG PO TABS
300.0000 mg | ORAL_TABLET | Freq: Once | ORAL | Status: AC
  Administered 2015-11-10: 300 mg via ORAL
  Filled 2015-11-09: qty 4

## 2015-11-09 MED ORDER — ASPIRIN 325 MG PO TABS
325.0000 mg | ORAL_TABLET | Freq: Once | ORAL | Status: AC
  Administered 2015-11-09: 325 mg via ORAL
  Filled 2015-11-09: qty 1

## 2015-11-09 NOTE — ED Provider Notes (Signed)
[facility]-EMERGENCY DEPT Provider Note   CSN: [id number] Arrival date & time: 11/09/15  2048  By signing my name below, I, Christel Mormon, attest that this documentation has been prepared under the direction and in the presence of Jacalyn Lefevre, MD . Electronically Signed: Christel Mormon, Scribe. 11/09/2015. 9:05 PM.    History   Chief Complaint Chief Complaint  Patient presents with  . Aphasia    The history is provided by the patient. No language interpreter was used.   HPI Comments:  Gina Kline is a 50 y.o. female who presents to the Emergency Department complaining of sudden onset, improving difficulty forming words and R arm numbness beginning at 8:30PM today. Pt states that she was playing with her dog outside, bent down, and got up unable to talk and with R arm numbness. Pt states that this lasted a couple minutes. Pt reports associated fatigue. Pt notes that she had a previous similar episode a few months ago without numbness, but did not seek treatment. Pt states that she now feels fine.  LNMP was 4 months ago. Pt denies taking ASA today, but takes Zoloft daily. Denies smoking, alcohol, or drug use.    Past Medical History:  Diagnosis Date  . Anxiety   . Depression   . Depression     Patient Active Problem List   Diagnosis Date Noted  . TIA (transient ischemic attack) 11/10/2015  . Aphasia 11/10/2015  . Right arm weakness 11/10/2015    Past Surgical History:  Procedure Laterality Date  . ABDOMINAL SURGERY    . tummy tuck      OB History    No data available       Home Medications    Prior to Admission medications   Medication Sig Start Date End Date Taking? Authorizing Provider  sertraline (ZOLOFT) 50 MG tablet Take 75 mg by mouth daily.   Yes Historical Provider, MD  aspirin 81 MG tablet Take 1 tablet (81 mg total) by mouth daily. 11/10/15   Standley Brooking, MD  atorvastatin (LIPITOR) 80 MG tablet Take 1 tablet (80 mg total) by mouth daily at 6 PM.  11/10/15   Standley Brooking, MD    Family History No family history on file.  Social History Social History  Substance Use Topics  . Smoking status: Former Smoker    Packs/day: 0.00  . Smokeless tobacco: Never Used  . Alcohol use No     Allergies   Vicodin [hydrocodone-acetaminophen]   Review of Systems Review of Systems  Constitutional: Positive for fatigue.  Neurological: Positive for speech difficulty and numbness.  All other systems reviewed and are negative.    Physical Exam Updated Vital Signs BP 118/81 (BP Location: Right Arm)   Pulse 85   Temp 98.2 F (36.8 C)   Resp 18   Ht 5\' 5"  (1.651 m)   Wt 137 lb 11.2 oz (62.5 kg)   LMP 05/08/2015 (Exact Date)   SpO2 96%   BMI 22.91 kg/m   Physical Exam  Constitutional: She appears well-developed and well-nourished. No distress.  HENT:  Head: Normocephalic and atraumatic.  Eyes: Conjunctivae are normal.  Neck: Neck supple.  Cardiovascular: Normal rate and regular rhythm.   No murmur heard. Pulmonary/Chest: Effort normal and breath sounds normal. No respiratory distress.  Abdominal: Soft. There is no tenderness.  Musculoskeletal: She exhibits no edema.  Neurological: She is alert.  Skin: Skin is warm and dry.  Psychiatric: She has a normal mood and affect.  Anxious  Nursing note and vitals reviewed.    ED Treatments / Results  DIAGNOSTIC STUDIES:  Oxygen Saturation is 98% on RA, normal by my interpretation.    COORDINATION OF CARE:  9:05 PM Will order blood work and CT. Discussed treatment plan with pt at bedside and pt agreed to plan.  Labs (all labs ordered are listed, but only abnormal results are displayed) Labs Reviewed  COMPREHENSIVE METABOLIC PANEL - Abnormal; Notable for the following:       Result Value   Glucose, Bld 101 (*)    Anion gap 3 (*)    All other components within normal limits  URINALYSIS, ROUTINE W REFLEX MICROSCOPIC (NOT AT [facility]) - Abnormal; Notable for the following:     Hgb urine dipstick TRACE (*)    Ketones, ur 15 (*)    Leukocytes, UA TRACE (*)    All other components within normal limits  URINE MICROSCOPIC-ADD ON - Abnormal; Notable for the following:    Squamous Epithelial / LPF 0-5 (*)    Bacteria, UA FEW (*)    All other components within normal limits  LIPID PANEL - Abnormal; Notable for the following:    LDL Cholesterol 115 (*)    All other components within normal limits  URINE RAPID DRUG SCREEN, HOSP PERFORMED  CBC  ETHANOL  TROPONIN I  HEMOGLOBIN A1C  ANTINUCLEAR ANTIBODIES, IFA  RHEUMATOID FACTOR  POC URINE PREG, ED  POC URINE PREG, ED    EKG  EKG Interpretation  Date/Time:  Saturday November 09 2015 21:07:08 EDT Ventricular Rate:  79 PR Interval:    QRS Duration: 97 QT Interval:  385 QTC Calculation: 442 R Axis:   80 Text Interpretation:  Sinus rhythm Borderline short PR interval Borderline repolarization abnormality Confirmed by Particia Nearing MD, Kacee Koren ([id number]) on 11/09/2015 9:15:55 PM       Radiology Ct Angio Head W Or Wo Contrast  Result Date: 11/10/2015 CLINICAL DATA:  Initial evaluation for acute onset word forming difficulty with right arm numbness, now resolved. EXAM: CT ANGIOGRAPHY HEAD AND NECK TECHNIQUE: Multidetector CT imaging of the head and neck was performed using the standard protocol during bolus administration of intravenous contrast. Multiplanar CT image reconstructions and MIPs were obtained to evaluate the vascular anatomy. Carotid stenosis measurements (when applicable) are obtained utilizing NASCET criteria, using the distal internal carotid diameter as the denominator. CONTRAST:  Not listed at time of this dictation. COMPARISON:  Prior noncontrast head CT from earlier the same day. FINDINGS: CTA NECK Aortic arch: Visualized aortic arch of normal caliber with normal 3 vessel morphology. No high-grade stenosis at the origin of the great vessels. Visualized subclavian arteries patent. Right carotid system: Right  common carotid artery patent from its origin to the bifurcation. Atheromatous irregularity about the right bifurcation/proximal right ICA the associated mild stenosis of approximately 20% by NASCET criteria. Right ICA well opacified distally without stenosis, dissection, or occlusion. Right external carotid artery and its branches within normal limits. Left carotid system: Left common carotid artery patent from its origin to the bifurcation. Short-segment atheromatous stenosis at the proximal left ICA of 40% by NASCET criteria. Distally, left ICA widely patent to the skullbase without stenosis, dissection, or occlusion. Left external carotid artery and its branches within normal limits. Vertebral arteries:Both vertebral arteries arise from the subclavian arteries. Vertebral arteries patent within the neck without stenosis, dissection, or occlusion. Skeleton: No acute osseous abnormality. No worrisome lytic or blastic osseous lesions. Mild multilevel degenerative spondylolysis within the visualized cervical spine, greatest  at C5-6. Other neck: Bilateral breast implants partially visualized. Visualized lungs are clear. Visualize mediastinum within normal limits. Thyroid gland normal. No adenopathy within the neck. No acute soft tissue abnormality. CTA HEAD Anterior circulation: Petrous, cavernous, and supraclinoid segments of the internal carotid arteries are widely patent without stenosis. Left A1 segment hypoplastic/absent. This likely accounts for the slightly diminutive left ICA is compared to the right. Right A1 segment widely patent. Anterior communicating artery normal. Anterior cerebral arteries opacified to their distal aspects. M1 segments patent without stenosis or occlusion. No proximal M2 occlusion. Distal MCA branches well opacified and symmetric. Posterior circulation: Vertebral arteries patent to the vertebrobasilar junction. V4 segments are mildly hypoplastic bilaterally. Posterior inferior cerebellar  arteries patent bilaterally. Basilar artery mildly diminutive but widely patent to its distal aspect. Superior cerebral arteries patent bilaterally. Right PCA arises from the basilar artery and is well opacified to its distal aspect. Fetal type left PCA supplied via a widely patent left posterior communicating artery. Left PCA also well supply to its distal aspect. Venous sinuses: Patent without evidence for venous sinus thrombosis. Anatomic variants: Hypoplastic/absent left A1 segment with the anterior cerebral arteries supplied via the right internal carotid artery system. Fetal type left PCA with somewhat diminutive vertebrobasilar system. No aneurysm or vascular malformation. Delayed phase: No pathologic enhancement. IMPRESSION: CTA NECK IMPRESSION: 1. Short-segment atheromatous stenosis at the proximal left ICA of approximately 40% by NASCET criteria. 2. Mild atheromatous irregularity with short-segment narrowing of up to 20% by NASCET criteria at the proximal right ICA. 3. Widely patent vertebral arteries within the neck. CTA HEAD IMPRESSION: Negative CTA of the head. No large or proximal arterial branch occlusion. No high-grade or correctable stenosis. No significant atheromatous disease within the intracranial circulation. Electronically Signed   By: Rise Mu M.D.   On: 11/10/2015 00:23   Dg Chest 2 View  Result Date: 11/09/2015 CLINICAL DATA:  Acute slurred speech and dizziness. EXAM: CHEST  2 VIEW COMPARISON:  07/11/2012 FINDINGS: The cardiomediastinal silhouette is unremarkable. There is no evidence of focal airspace disease, pulmonary edema, suspicious pulmonary nodule/mass, pleural effusion, or pneumothorax. No acute bony abnormalities are identified. IMPRESSION: No active cardiopulmonary disease. Electronically Signed   By: Harmon Pier M.D.   On: 11/09/2015 21:51   Ct Head Wo Contrast  Result Date: 11/09/2015 CLINICAL DATA:  49 year old female with acute aphasia and right arm  numbness. EXAM: CT HEAD WITHOUT CONTRAST TECHNIQUE: Contiguous axial images were obtained from the base of the skull through the vertex without intravenous contrast. COMPARISON:  None. FINDINGS: Brain: No evidence of infarction, hemorrhage, hydrocephalus, extra-axial collection or mass lesion/mass effect. Vascular: No hyperdense vessel or unexpected calcification. Skull: Unremarkable Sinuses/Orbits: Visualized portions unremarkable Other: None. IMPRESSION: Normal noncontrast head CT. Electronically Signed   By: Harmon Pier M.D.   On: 11/09/2015 21:45   Ct Angio Neck W And/or Wo Contrast  Result Date: 11/10/2015 CLINICAL DATA:  Initial evaluation for acute onset word forming difficulty with right arm numbness, now resolved. EXAM: CT ANGIOGRAPHY HEAD AND NECK TECHNIQUE: Multidetector CT imaging of the head and neck was performed using the standard protocol during bolus administration of intravenous contrast. Multiplanar CT image reconstructions and MIPs were obtained to evaluate the vascular anatomy. Carotid stenosis measurements (when applicable) are obtained utilizing NASCET criteria, using the distal internal carotid diameter as the denominator. CONTRAST:  Not listed at time of this dictation. COMPARISON:  Prior noncontrast head CT from earlier the same day. FINDINGS: CTA NECK Aortic arch: Visualized aortic  arch of normal caliber with normal 3 vessel morphology. No high-grade stenosis at the origin of the great vessels. Visualized subclavian arteries patent. Right carotid system: Right common carotid artery patent from its origin to the bifurcation. Atheromatous irregularity about the right bifurcation/proximal right ICA the associated mild stenosis of approximately 20% by NASCET criteria. Right ICA well opacified distally without stenosis, dissection, or occlusion. Right external carotid artery and its branches within normal limits. Left carotid system: Left common carotid artery patent from its origin to the  bifurcation. Short-segment atheromatous stenosis at the proximal left ICA of 40% by NASCET criteria. Distally, left ICA widely patent to the skullbase without stenosis, dissection, or occlusion. Left external carotid artery and its branches within normal limits. Vertebral arteries:Both vertebral arteries arise from the subclavian arteries. Vertebral arteries patent within the neck without stenosis, dissection, or occlusion. Skeleton: No acute osseous abnormality. No worrisome lytic or blastic osseous lesions. Mild multilevel degenerative spondylolysis within the visualized cervical spine, greatest at C5-6. Other neck: Bilateral breast implants partially visualized. Visualized lungs are clear. Visualize mediastinum within normal limits. Thyroid gland normal. No adenopathy within the neck. No acute soft tissue abnormality. CTA HEAD Anterior circulation: Petrous, cavernous, and supraclinoid segments of the internal carotid arteries are widely patent without stenosis. Left A1 segment hypoplastic/absent. This likely accounts for the slightly diminutive left ICA is compared to the right. Right A1 segment widely patent. Anterior communicating artery normal. Anterior cerebral arteries opacified to their distal aspects. M1 segments patent without stenosis or occlusion. No proximal M2 occlusion. Distal MCA branches well opacified and symmetric. Posterior circulation: Vertebral arteries patent to the vertebrobasilar junction. V4 segments are mildly hypoplastic bilaterally. Posterior inferior cerebellar arteries patent bilaterally. Basilar artery mildly diminutive but widely patent to its distal aspect. Superior cerebral arteries patent bilaterally. Right PCA arises from the basilar artery and is well opacified to its distal aspect. Fetal type left PCA supplied via a widely patent left posterior communicating artery. Left PCA also well supply to its distal aspect. Venous sinuses: Patent without evidence for venous sinus  thrombosis. Anatomic variants: Hypoplastic/absent left A1 segment with the anterior cerebral arteries supplied via the right internal carotid artery system. Fetal type left PCA with somewhat diminutive vertebrobasilar system. No aneurysm or vascular malformation. Delayed phase: No pathologic enhancement. IMPRESSION: CTA NECK IMPRESSION: 1. Short-segment atheromatous stenosis at the proximal left ICA of approximately 40% by NASCET criteria. 2. Mild atheromatous irregularity with short-segment narrowing of up to 20% by NASCET criteria at the proximal right ICA. 3. Widely patent vertebral arteries within the neck. CTA HEAD IMPRESSION: Negative CTA of the head. No large or proximal arterial branch occlusion. No high-grade or correctable stenosis. No significant atheromatous disease within the intracranial circulation. Electronically Signed   By: Rise Mu M.D.   On: 11/10/2015 00:23   Mr Brain Wo Contrast  Result Date: 11/10/2015 CLINICAL DATA:  Episode of aphasia and right arm weakness, now resolved. EXAM: MRI HEAD WITHOUT CONTRAST TECHNIQUE: Multiplanar, multiecho pulse sequences of the brain and surrounding structures were obtained without intravenous contrast. COMPARISON:  Head CT/ CTA 11/09/2015 FINDINGS: There are small foci of acute infarction in the posterior left frontal lobe MCA territory, with the largest measuring 9 mm at the level of the operculum. A few punctate foci of infarct are present in the subcortical left frontal white matter at the level of the centrum semiovale. There is no evidence of intracranial hemorrhage, mass, midline shift, or extra-axial fluid collection. The ventricles and sulci are normal  in size. There are multiple small foci of T2 hyperintensity in the predominantly subcortical cerebral white matter bilaterally, mild in severity but abnormal for age. Orbits are unremarkable. There is a small right mastoid effusion, and there is mild right ethmoid air cell mucosal  thickening. Major intracranial vascular flow voids are preserved. No focal osseous lesion is identified. IMPRESSION: 1. 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 2. Mild cerebral white matter disease, abnormal for age and nonspecific. This may reflect chronic small vessel ischemia, with other considerations including sequelae of trauma, hypercoagulable state, vasculitis, migraines, prior infection, and less likely demyelination. Electronically Signed   By: Sebastian AcheAllen  Grady M.D.   On: 11/10/2015 12:07    Procedures Procedures (including critical care time)  Medications Ordered in ED Medications  aspirin suppository 300 mg ( Rectal See Alternative 11/10/15 0912)    Or  aspirin tablet 325 mg (325 mg Oral Given 11/10/15 0912)  atorvastatin (LIPITOR) tablet 40 mg (40 mg Oral Given 11/10/15 1636)  aspirin tablet 325 mg (325 mg Oral Given 11/09/15 2205)  metoprolol (LOPRESSOR) injection 5 mg (5 mg Intravenous Given 11/09/15 2234)  clopidogrel (PLAVIX) tablet 300 mg (300 mg Oral Given 11/10/15 0023)   stroke: mapping our early stages of recovery book (1 each Does not apply Given 11/10/15 0200)  LORazepam (ATIVAN) tablet 0.5 mg (0.5 mg Oral Given 11/10/15 1015)     Initial Impression / Assessment and Plan / ED Course  I have reviewed the triage vital signs and the nursing notes.  Pertinent labs & imaging results that were available during my care of the patient were reviewed by me and considered in my medical decision making (see chart for details).  Clinical Course   While here, pt had another episode of aphasia and right arm numbness and weakness.  The pt said that her first language is not [place], but [place] and [place] and she could not speak in those languages either.  The nurse saw this event.  It resolved by the time I saw pt.  Pt d/w Dr. Onalee Huaavid who requested a CT angio head and neck as we have no MRI.  She also requested that I call neurology.  I initially spoke with Dr. Gerilyn Pilgrimoonquah who said  he was only on call for his patients, so I then spoke with Dr. Amada JupiterKirkpatrick who also recommended the CT angio head and neck.  He recommended dual antiplatelet therapy with ASA and a plavix load of 300 mg.  He said that as long as there was no critical stenosis, pt could stay here.  Final Clinical Impressions(s) / ED Diagnoses   Final diagnoses:  Transient cerebral ischemia, unspecified transient cerebral ischemia type  Essential hypertension    New Prescriptions Discharge Medication List as of 11/10/2015  4:07 PM    START taking these medications   Details  aspirin 81 MG tablet Take 1 tablet (81 mg total) by mouth daily., Starting Sun 11/10/2015, No Print    atorvastatin (LIPITOR) 80 MG tablet Take 1 tablet (80 mg total) by mouth daily at 6 PM., Starting Sun 11/10/2015, Normal       I personally performed the services described in this documentation, which was scribed in my presence. The recorded information has been reviewed and is accurate.      Jacalyn LefevreJulie Swannie Milius, MD 11/10/15 [phone]

## 2015-11-09 NOTE — ED Triage Notes (Signed)
Pt c/o  Intermittent trouble forming her words and numbness to right arm  that started after she had gotten home from work tonight at [employer]20:30. Pt states that the episode lasted a few minutes then went away,  Pt reports that she has had an episode like this before a few months ago.  On arrival to er pt alert, speech clear, able to walk to stretcher with no problems, denies any numbness

## 2015-11-09 NOTE — ED Notes (Signed)
Dr Particia NearingHaviland at bedside,

## 2015-11-10 ENCOUNTER — Observation Stay (HOSPITAL_BASED_OUTPATIENT_CLINIC_OR_DEPARTMENT_OTHER): Payer: Self-pay

## 2015-11-10 ENCOUNTER — Observation Stay (HOSPITAL_COMMUNITY): Payer: Self-pay

## 2015-11-10 DIAGNOSIS — R29898 Other symptoms and signs involving the musculoskeletal system: Secondary | ICD-10-CM | POA: Diagnosis present

## 2015-11-10 DIAGNOSIS — I6350 Cerebral infarction due to unspecified occlusion or stenosis of unspecified cerebral artery: Secondary | ICD-10-CM

## 2015-11-10 DIAGNOSIS — R4701 Aphasia: Secondary | ICD-10-CM | POA: Diagnosis present

## 2015-11-10 DIAGNOSIS — G459 Transient cerebral ischemic attack, unspecified: Secondary | ICD-10-CM

## 2015-11-10 DIAGNOSIS — I63512 Cerebral infarction due to unspecified occlusion or stenosis of left middle cerebral artery: Secondary | ICD-10-CM

## 2015-11-10 DIAGNOSIS — I10 Essential (primary) hypertension: Secondary | ICD-10-CM | POA: Diagnosis present

## 2015-11-10 LAB — ECHOCARDIOGRAM COMPLETE
CHL CUP MV DEC (S): 182
CHL CUP STROKE VOLUME: 29 mL
E decel time: 182 msec
EERAT: 5.65
FS: 31 % (ref 28–44)
HEIGHTINCHES: 65 in
IV/PV OW: 1.25
LA vol: 39.3 mL
LADIAMINDEX: 1.89 cm/m2
LASIZE: 32 mm
LAVOLA4C: 40.8 mL
LAVOLIN: 23.2 mL/m2
LEFT ATRIUM END SYS DIAM: 32 mm
LV PW d: 9.38 mm — AB (ref 0.6–1.1)
LV SIMPSON'S DISK: 63
LV TDI E'LATERAL: 10.4
LV TDI E'MEDIAL: 5.87
LV dias vol index: 27 mL/m2
LV dias vol: 46 mL (ref 46–106)
LV sys vol index: 10 mL/m2
LV sys vol: 17 mL (ref 14–42)
LVEEAVG: 5.65
LVEEMED: 5.65
LVELAT: 10.4 cm/s
LVOT VTI: 17.3 cm
LVOT area: 2.84 cm2
LVOT diameter: 19 mm
LVOT peak grad rest: 3 mmHg
LVOTPV: 81.1 cm/s
LVOTSV: 49 mL
Lateral S' vel: 13.4 cm/s
MVPKAVEL: 56.3 m/s
MVPKEVEL: 58.8 m/s
RV sys press: 17 mmHg
Reg peak vel: 189 cm/s
TAPSE: 18.8 mm
TRMAXVEL: 189 cm/s
WEIGHTICAEL: 2203.2 [oz_av]

## 2015-11-10 LAB — LIPID PANEL
CHOL/HDL RATIO: 3.2 ratio
Cholesterol: 196 mg/dL (ref 0–200)
HDL: 62 mg/dL (ref >40)
LDL Cholesterol: 115 mg/dL — ABNORMAL HIGH (ref 0–99)
TRIGLYCERIDES: 93 mg/dL (ref <150)
VLDL: 19 mg/dL (ref 0–40)

## 2015-11-10 MED ORDER — ATORVASTATIN CALCIUM 80 MG PO TABS: 80.0000 mg | ORAL_TABLET | Freq: Every day | ORAL | 1 refills | Status: DC

## 2015-11-10 MED ORDER — LORAZEPAM 0.5 MG PO TABS
0.5000 mg | ORAL_TABLET | Freq: Once | ORAL | Status: AC
  Administered 2015-11-10: 0.5 mg via ORAL
  Filled 2015-11-10: qty 1

## 2015-11-10 MED ORDER — ASPIRIN 81 MG PO TABS: 81.0000 mg | ORAL_TABLET | Freq: Every day | ORAL | Status: AC

## 2015-11-10 MED ORDER — ATORVASTATIN CALCIUM 40 MG PO TABS
40.0000 mg | ORAL_TABLET | Freq: Every day | ORAL | Status: DC
  Administered 2015-11-10: 40 mg via ORAL
  Filled 2015-11-10: qty 1

## 2015-11-10 MED ORDER — STROKE: EARLY STAGES OF RECOVERY BOOK
Freq: Once | Status: AC
  Administered 2015-11-10: 1
  Filled 2015-11-10: qty 1

## 2015-11-10 MED ORDER — ASPIRIN 325 MG PO TABS
325.0000 mg | ORAL_TABLET | Freq: Every day | ORAL | Status: DC
  Administered 2015-11-10: 325 mg via ORAL
  Filled 2015-11-10: qty 1

## 2015-11-10 MED ORDER — ATORVASTATIN CALCIUM 20 MG PO TABS: 20.0000 mg | ORAL_TABLET | Freq: Every day | ORAL | Status: DC

## 2015-11-10 MED ORDER — CLOPIDOGREL BISULFATE 75 MG PO TABS
75.0000 mg | ORAL_TABLET | Freq: Every day | ORAL | Status: DC
  Administered 2015-11-10: 75 mg via ORAL
  Filled 2015-11-10: qty 1

## 2015-11-10 MED ORDER — ASPIRIN 300 MG RE SUPP: 300.0000 mg | Freq: Every day | RECTAL | Status: DC

## 2015-11-10 NOTE — Progress Notes (Signed)
PROGRESS NOTE  Gina Kline UJW:[id number] DOB: Apr 18, 1965 DOA: 11/09/2015 PCP: No PCP Per Patient  Brief Narrative: [age] yow with a hx of anxiety, presents to the ED with complaints of aphasia and right arm numbness and weakness. While in the ED, she was afebrile, hypertensive, and troponin <0.03. EKG showed sinus rhythm. She was admitted for further treatment of her stroke. Since her admission, her blood pressure has resolved. She is doing much better and will likely be discharged home later today.  Assessment/Plan: 1. TIA with aphasia, right arm numbness & weakness. Completely resolved. CTA head  short-segment atheromatous stenosis at the proximal left ICA of approximately 40%. Neurology rec dual antiplatelet ASA & Plavix, no indication for intervention or transfer to [facility]. LDL 115. 2. Anxiety, depression   Asymptomatic at this point with no deficits  Follow up MRI brain and ECHO  Possibly home later today.  Will need to establish with PCP on discharge & follow-up with neurology 2 months  DVT prophylaxis: SCDs Code Status: Full Family Communication: Family at bedside Disposition Plan: Discharge home once improved  Gina Sacksaniel Juniper Snyders, MD  [facility] Direct contact: [phone] --Via amion app OR  --[email or web address]; password [email or web address]  7PM-7AM contact night coverage as above 11/10/2015, 6:47 AM  LOS: 0 days   Consultants:  None  Procedures:  Echo  Antimicrobials:  None  HPI/Subjective: Feeling better today. No numbness in right hand or aphasia today. Denies any significant medical hx. Denies any headaches.  Objective: Vitals:   11/10/15 0131 11/10/15 0146 11/10/15 0330 11/10/15 0530  BP: (!) 153/96  119/73 116/71  Pulse: 65  74 77  Resp: 20  16 15   Temp: 98.1 F (36.7 C)  97.8 F (36.6 C) 97.8 F (36.6 C)  TempSrc: Oral  Oral Oral  SpO2: 99% 97% 97% 97%  Weight: 62.5 kg (137 lb 11.2 oz)     Height:       No intake or output data in the 24 hours ending  11/10/15 0647   Filed Weights   11/09/15 2056 11/10/15 0131  Weight: 62.1 kg (137 lb) 62.5 kg (137 lb 11.2 oz)    Exam:    Constitutional:  . Appears calm and comfortable Eyes:  . PERRL and irises appear normal . Conjunctivae and lids appear normal ENMT:  . grossly normal hearing  . Lips appear normal; teeth , Neck:  . neck appears normal, no masses . no thyromegaly Respiratory:  . CTA bilaterally, no w/r/r.  . Respiratory effort normal. No retractions or accessory muscle use Cardiovascular:  . RRR, no m/r/g . No LE extremity edema   Musculoskeletal:   Tone and strength symmetric and appears normal extremities. Neurologic:  . CN 2-12 intact . Sensation all 4 extremities intact Psychiatric:  . judgement and insight appear normal . Mental status o Mood, affect appropriate  I have personally reviewed following labs and imaging studies:  Imaging noted.  LDL 115  Scheduled Meds: .  stroke: mapping our early stages of recovery book   Does not apply Once  . aspirin  300 mg Rectal Daily   Or  . aspirin  325 mg Oral Daily  . clopidogrel  75 mg Oral Daily   Continuous Infusions:   Principal Problem:   TIA (transient ischemic attack) Active Problems:   Aphasia   Right arm weakness   Essential hypertension   LOS: 0 days   Time spent 25 minutes  By signing my name below, I, Gina Stackhristopher Reid, attest that this documentation has  been prepared under the direction and in the presence of Gina Endres P. Irene Limbo, MD. Electronically signed: Bobbie Kline, Scribe.  11/10/15,      I personally performed the services described in this documentation. All medical record entries made by the scribe were at my direction. I have reviewed the chart and agree that the record reflects my personal performance and is accurate and complete. Gina Sacks, MD

## 2015-11-10 NOTE — Progress Notes (Deleted)
*  PRELIMINARY RESULTS* Echocardiogram 2D Echocardiogram has been performed with Definity.  Stacey DrainWhite, Avi Archuleta J 11/10/2015, 10:54 AM

## 2015-11-10 NOTE — Progress Notes (Signed)
Patient discharged home, with prescriptions, discharge papers, and personal belongings.  IV removed and site intact.

## 2015-11-10 NOTE — ED Provider Notes (Signed)
I discussed CTA results with ICA narrowing with [facility] and he stated that there would not be any procedures for that and that she could be admitted to [facility] for TIA obs.    Marily MemosJason Skilynn Durney, MD 11/10/15 [phone]

## 2015-11-10 NOTE — Discharge Summary (Signed)
Physician Discharge Summary  Gina Kline ZOX:[id number] DOB: [birth year] DOA: 11/09/2015  PCP: No PCP Per Patient  Admit date: 11/09/2015 Discharge date: 11/10/2015  Recommendations for Outpatient Follow-up:  1. Follow-up stroke with neurology as below. Also follow-up ANA, rheumatoid factor, mild cerebral white matter disease of unclear etiology and significance. 2. Have recommended patient establish with primary care physician of choice, RN is provided with material on this   Follow-up Information    DOONQUAH, KOFI, MD. Schedule an appointment as soon as possible for a visit in 2 month(s).   Specialty:  Neurology Contact information: [address] [place] [address] [phone]          Discharge Diagnoses:  1. 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 Mild cerebral white matter disease, abnormal for age and nonspecific 3. Anxiety, depression  Discharge Condition: Stable Disposition: Home  Diet recommendation: Heart Healthy  Filed Weights   11/09/15 2056 11/10/15 0131  Weight: 62.1 kg (137 lb) 62.5 kg (137 lb 11.2 oz)    History of present illness:  50 year old woman presented with expressive aphasia, right arm numbness and weakness. She was admitted for evaluation for TIA versus stroke.  Hospital Course:  Symptoms resolved on admission and have not recurred. No deficits on exam on discharge. MRI revealed stroke and CT angiogram of the neck revealed 40% proximal ICA stenosis. Case was discussed with neurology 9/2 by EDP who recommended conservative management. Echocardiogram was reassuring. Case again discussed by myself with Dr. Roxy Manns. We discussed MRI findings and echocardiogram findings. He recommended low-dose aspirin, statin. No further evaluation suggested other than rheumatoid factor and ANA. Echocardiogram felt to be reassuring and no further evaluation recommended by neurology. Patient reports he had  an episode several months ago of similar symptoms. She also reports a history of headaches that can be severe, chronic several times per month. We discussed the findings of her MRI, including the nonspecific findings. She has no history to suggest vasculitis at this point. White matter changes may be related to headaches. She is a longtime smoker but quit 18 months. No other risk factors identified.   1. 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 Assessed by physical therapy, no outpatient needs identified. On physician evaluation, no indication for occupational or speech therapy involvement.  2. Anxiety, depression  Consultants:  None  Procedures:  Echo   - Procedure narrative: Transthoracic echocardiography. Image   quality was fair. The study was technically difficult, as a   result of breast implants. Intravenous contrast (Definity) was   administered. - Left ventricle: The cavity size was normal. Wall thickness was   increased in a pattern of mild LVH. Systolic function was normal.   The estimated ejection fraction was in the range of 60% to 65%.   Wall motion was normal; there were no regional wall motion   abnormalities. Codominant mitral inflow - valsalva not performed.   Borderline diastolic dysfunction. - Mitral valve: Mild thickening and late systolic bowing of the   leaflets. Trivial regurgitation. - Left atrium: The atrium was normal in size. - Atrial septum: Mobile IAS- not well visualized. Cannot exclude   PFO. - Tricuspid valve: There was trivial regurgitation. - Pulmonary arteries: PA peak pressure: 17 mm Hg (S). - Inferior vena cava: The vessel was normal in size. The   respirophasic diameter changes were in the normal range (>= 50%),  consistent with normal central venous pressure.  Antimicrobials:  None     Discharge Instructions  Discharge Instructions    Diet -  low sodium heart healthy    Complete by:  As directed   Discharge instructions    Complete by:  As directed   Call your physician or seek immediate medical attention for weakness, numbness, tingling, difficulty speaking or swallowing or worsening of condition.   Increase activity slowly    Complete by:  As directed       Medication List    TAKE these medications   aspirin 81 MG tablet Take 1 tablet (81 mg total) by mouth daily.   atorvastatin 80 MG tablet Commonly known as:  LIPITOR Take 1 tablet (80 mg total) by mouth daily at 6 PM.   sertraline 50 MG tablet Commonly known as:  ZOLOFT Take 75 mg by mouth daily.      Allergies  Allergen Reactions  . Vicodin [Hydrocodone-Acetaminophen]     Pt states that it makes her "feel weird"    The results of significant diagnostics from this hospitalization (including imaging, microbiology, ancillary and laboratory) are listed below for reference.    Significant Diagnostic Studies: Ct Angio Head W Or Wo Contrast  Result Date: 11/10/2015 CLINICAL DATA:  Initial evaluation for acute onset word forming difficulty with right arm numbness, now resolved. EXAM: CT ANGIOGRAPHY HEAD AND NECK TECHNIQUE: Multidetector CT imaging of the head and neck was performed using the standard protocol during bolus administration of intravenous contrast. Multiplanar CT image reconstructions and MIPs were obtained to evaluate the vascular anatomy. Carotid stenosis measurements (when applicable) are obtained utilizing NASCET criteria, using the distal internal carotid diameter as the denominator. CONTRAST:  Not listed at time of this dictation. COMPARISON:  Prior noncontrast head CT from earlier the same day. FINDINGS: CTA NECK Aortic arch: Visualized aortic arch of normal caliber with normal 3 vessel morphology. No high-grade stenosis at the origin of the great vessels. Visualized subclavian arteries patent. Right carotid system: Right common carotid artery patent from  its origin to the bifurcation. Atheromatous irregularity about the right bifurcation/proximal right ICA the associated mild stenosis of approximately 20% by NASCET criteria. Right ICA well opacified distally without stenosis, dissection, or occlusion. Right external carotid artery and its branches within normal limits. Left carotid system: Left common carotid artery patent from its origin to the bifurcation. Short-segment atheromatous stenosis at the proximal left ICA of 40% by NASCET criteria. Distally, left ICA widely patent to the skullbase without stenosis, dissection, or occlusion. Left external carotid artery and its branches within normal limits. Vertebral arteries:Both vertebral arteries arise from the subclavian arteries. Vertebral arteries patent within the neck without stenosis, dissection, or occlusion. Skeleton: No acute osseous abnormality. No worrisome lytic or blastic osseous lesions. Mild multilevel degenerative spondylolysis within the visualized cervical spine, greatest at C5-6. Other neck: Bilateral breast implants partially visualized. Visualized lungs are clear. Visualize mediastinum within normal limits. Thyroid gland normal. No adenopathy within the neck. No acute soft tissue abnormality. CTA HEAD Anterior circulation: Petrous, cavernous, and supraclinoid segments of the internal carotid arteries are widely patent without stenosis. Left A1 segment hypoplastic/absent. This likely accounts for the slightly diminutive left ICA is compared to the right. Right A1 segment widely patent. Anterior communicating artery normal. Anterior cerebral arteries opacified to their distal aspects. M1 segments patent without stenosis or occlusion. No proximal M2 occlusion. Distal MCA branches well opacified and symmetric. Posterior circulation: Vertebral arteries patent to the vertebrobasilar junction.  V4 segments are mildly hypoplastic bilaterally. Posterior inferior cerebellar arteries patent bilaterally.  Basilar artery mildly diminutive but widely patent to its distal aspect. Superior cerebral arteries patent bilaterally. Right PCA arises from the basilar artery and is well opacified to its distal aspect. Fetal type left PCA supplied via a widely patent left posterior communicating artery. Left PCA also well supply to its distal aspect. Venous sinuses: Patent without evidence for venous sinus thrombosis. Anatomic variants: Hypoplastic/absent left A1 segment with the anterior cerebral arteries supplied via the right internal carotid artery system. Fetal type left PCA with somewhat diminutive vertebrobasilar system. No aneurysm or vascular malformation. Delayed phase: No pathologic enhancement. IMPRESSION: CTA NECK IMPRESSION: 1. Short-segment atheromatous stenosis at the proximal left ICA of approximately 40% by NASCET criteria. 2. Mild atheromatous irregularity with short-segment narrowing of up to 20% by NASCET criteria at the proximal right ICA. 3. Widely patent vertebral arteries within the neck. CTA HEAD IMPRESSION: Negative CTA of the head. No large or proximal arterial branch occlusion. No high-grade or correctable stenosis. No significant atheromatous disease within the intracranial circulation. Electronically Signed   By: Rise MuBenjamin  McClintock M.D.   On: 11/10/2015 00:23   Dg Chest 2 View  Result Date: 11/09/2015 CLINICAL DATA:  Acute slurred speech and dizziness. EXAM: CHEST  2 VIEW COMPARISON:  07/11/2012 FINDINGS: The cardiomediastinal silhouette is unremarkable. There is no evidence of focal airspace disease, pulmonary edema, suspicious pulmonary nodule/mass, pleural effusion, or pneumothorax. No acute bony abnormalities are identified. IMPRESSION: No active cardiopulmonary disease. Electronically Signed   By: Harmon PierJeffrey  Hu M.D.   On: 11/09/2015 21:51   Ct Head Wo Contrast  Result Date: 11/09/2015 CLINICAL DATA:  50 year old female with acute aphasia and right arm numbness. EXAM: CT HEAD WITHOUT  CONTRAST TECHNIQUE: Contiguous axial images were obtained from the base of the skull through the vertex without intravenous contrast. COMPARISON:  None. FINDINGS: Brain: No evidence of infarction, hemorrhage, hydrocephalus, extra-axial collection or mass lesion/mass effect. Vascular: No hyperdense vessel or unexpected calcification. Skull: Unremarkable Sinuses/Orbits: Visualized portions unremarkable Other: None. IMPRESSION: Normal noncontrast head CT. Electronically Signed   By: Harmon PierJeffrey  Hu M.D.   On: 11/09/2015 21:45   Ct Angio Neck W And/or Wo Contrast  Result Date: 11/10/2015 CLINICAL DATA:  Initial evaluation for acute onset word forming difficulty with right arm numbness, now resolved. EXAM: CT ANGIOGRAPHY HEAD AND NECK TECHNIQUE: Multidetector CT imaging of the head and neck was performed using the standard protocol during bolus administration of intravenous contrast. Multiplanar CT image reconstructions and MIPs were obtained to evaluate the vascular anatomy. Carotid stenosis measurements (when applicable) are obtained utilizing NASCET criteria, using the distal internal carotid diameter as the denominator. CONTRAST:  Not listed at time of this dictation. COMPARISON:  Prior noncontrast head CT from earlier the same day. FINDINGS: CTA NECK Aortic arch: Visualized aortic arch of normal caliber with normal 3 vessel morphology. No high-grade stenosis at the origin of the great vessels. Visualized subclavian arteries patent. Right carotid system: Right common carotid artery patent from its origin to the bifurcation. Atheromatous irregularity about the right bifurcation/proximal right ICA the associated mild stenosis of approximately 20% by NASCET criteria. Right ICA well opacified distally without stenosis, dissection, or occlusion. Right external carotid artery and its branches within normal limits. Left carotid system: Left common carotid artery patent from its origin to the bifurcation. Short-segment  atheromatous stenosis at the proximal left ICA of 40% by NASCET criteria. Distally, left ICA widely patent to the skullbase without  stenosis, dissection, or occlusion. Left external carotid artery and its branches within normal limits. Vertebral arteries:Both vertebral arteries arise from the subclavian arteries. Vertebral arteries patent within the neck without stenosis, dissection, or occlusion. Skeleton: No acute osseous abnormality. No worrisome lytic or blastic osseous lesions. Mild multilevel degenerative spondylolysis within the visualized cervical spine, greatest at C5-6. Other neck: Bilateral breast implants partially visualized. Visualized lungs are clear. Visualize mediastinum within normal limits. Thyroid gland normal. No adenopathy within the neck. No acute soft tissue abnormality. CTA HEAD Anterior circulation: Petrous, cavernous, and supraclinoid segments of the internal carotid arteries are widely patent without stenosis. Left A1 segment hypoplastic/absent. This likely accounts for the slightly diminutive left ICA is compared to the right. Right A1 segment widely patent. Anterior communicating artery normal. Anterior cerebral arteries opacified to their distal aspects. M1 segments patent without stenosis or occlusion. No proximal M2 occlusion. Distal MCA branches well opacified and symmetric. Posterior circulation: Vertebral arteries patent to the vertebrobasilar junction. V4 segments are mildly hypoplastic bilaterally. Posterior inferior cerebellar arteries patent bilaterally. Basilar artery mildly diminutive but widely patent to its distal aspect. Superior cerebral arteries patent bilaterally. Right PCA arises from the basilar artery and is well opacified to its distal aspect. Fetal type left PCA supplied via a widely patent left posterior communicating artery. Left PCA also well supply to its distal aspect. Venous sinuses: Patent without evidence for venous sinus thrombosis. Anatomic variants:  Hypoplastic/absent left A1 segment with the anterior cerebral arteries supplied via the right internal carotid artery system. Fetal type left PCA with somewhat diminutive vertebrobasilar system. No aneurysm or vascular malformation. Delayed phase: No pathologic enhancement. IMPRESSION: CTA NECK IMPRESSION: 1. Short-segment atheromatous stenosis at the proximal left ICA of approximately 40% by NASCET criteria. 2. Mild atheromatous irregularity with short-segment narrowing of up to 20% by NASCET criteria at the proximal right ICA. 3. Widely patent vertebral arteries within the neck. CTA HEAD IMPRESSION: Negative CTA of the head. No large or proximal arterial branch occlusion. No high-grade or correctable stenosis. No significant atheromatous disease within the intracranial circulation. Electronically Signed   By: Rise Mu M.D.   On: 11/10/2015 00:23   Mr Brain Wo Contrast  Result Date: 11/10/2015 CLINICAL DATA:  Episode of aphasia and right arm weakness, now resolved. EXAM: MRI HEAD WITHOUT CONTRAST TECHNIQUE: Multiplanar, multiecho pulse sequences of the brain and surrounding structures were obtained without intravenous contrast. COMPARISON:  Head CT/ CTA 11/09/2015 FINDINGS: There are small foci of acute infarction in the posterior left frontal lobe MCA territory, with the largest measuring 9 mm at the level of the operculum. A few punctate foci of infarct are present in the subcortical left frontal white matter at the level of the centrum semiovale. There is no evidence of intracranial hemorrhage, mass, midline shift, or extra-axial fluid collection. The ventricles and sulci are normal in size. There are multiple small foci of T2 hyperintensity in the predominantly subcortical cerebral white matter bilaterally, mild in severity but abnormal for age. Orbits are unremarkable. There is a small right mastoid effusion, and there is mild right ethmoid air cell mucosal thickening. Major intracranial  vascular flow voids are preserved. No focal osseous lesion is identified. IMPRESSION: 1. 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 2. Mild cerebral white matter disease, abnormal for age and nonspecific. This may reflect chronic small vessel ischemia, with other considerations including sequelae of trauma, hypercoagulable state, vasculitis, migraines, prior infection, and less likely demyelination. Electronically Signed   By: Sebastian Ache  M.D.   On: 11/10/2015 12:07    Labs: Basic Metabolic Panel:  Recent Labs Lab 11/09/15 2100  NA 137  K 3.7  CL 107  CO2 27  GLUCOSE 101*  BUN 16  CREATININE 0.64  CALCIUM 8.9   Liver Function Tests:  Recent Labs Lab 11/09/15 2100  AST 21  ALT 15  ALKPHOS 62  BILITOT 0.5  PROT 7.6  ALBUMIN 4.7   CBC:  Recent Labs Lab 11/09/15 2100  WBC 6.5  HGB 13.8  HCT 40.8  MCV 92.7  PLT 291   Cardiac Enzymes:  Recent Labs Lab 11/09/15 2100  TROPONINI <0.03    Principal Problem:   TIA (transient ischemic attack) Active Problems:   Aphasia   Right arm weakness   Time coordinating discharge: 35 minutes  Signed:  Brendia Sacks, MD [facility] 11/10/2015, 4:50 PM   By signing my name below, I, Bobbie Stack, attest that this documentation has been prepared under the direction and in the presence of Gina Trimmer P. Irene Limbo, MD. Electronically signed: Bobbie Stack, Scribe.  11/10/15,   I personally performed the services described in this documentation. All medical record entries made by the scribe were at my direction. I have reviewed the chart and agree that the record reflects my personal performance and is accurate and complete. Brendia Sacks, MD

## 2015-11-10 NOTE — Progress Notes (Signed)
*  PRELIMINARY RESULTS* Echocardiogram 2D Echocardiogram has been performed with Definity.  Stacey DrainWhite, Manson Luckadoo J 11/10/2015, 10:56 AM

## 2015-11-10 NOTE — Evaluation (Signed)
Physical Therapy Evaluation Patient Details Name: Gina Kline MRN: [id number] DOB: 18-Jul-1965 Today's Date: 11/10/2015   History of Present Illness  [age] yow with a hx of anxiety, presents to the ED with complaints of aphasia and right arm numbness and weakness. While in the ED, she was afebrile, hypertensive, and troponin <0.03. EKG showed sinus rhythm. She was admitted for further treatment of her stroke. Since her admission, her blood pressure has resolved.  TIA/CVA with aphasia, right arm numbness & weakness. CTA head shows short-segment atheromatous stenosis at the proximal left ICA of approximately 40%. Neurology rec dual antiplatelet ASA & Plavix, [facility] LDL 115.  PMH: Anxiety, depression, abdominal surgery - tummy tuck  Clinical Impression  Pt received in bed, pt's ex-husband/friend present during PT evaluation.  Pt expressed that she feels all her symptoms have resolved.  She was independent with all functional mobility and ambulation during today's PT evaluation.  No evidence of LOB or mobility deficits at this time.  PT will sign off, no f/u PT needs.      Follow Up Recommendations No PT follow up    Equipment Recommendations  None recommended by PT    Recommendations for Other Services       Precautions / Restrictions Precautions Precautions: Fall Precaution Comments: slipped on a wet spot - hit her knee.  R knee still hurts from it.  Restrictions Weight Bearing Restrictions: No      Mobility  Bed Mobility Overal bed mobility: Independent                Transfers Overall transfer level: Independent                  Ambulation/Gait Ambulation/Gait assistance: Independent Ambulation Distance (Feet): 400 Feet Assistive device: None Gait Pattern/deviations: WFL(Within Functional Limits)        Stairs Stairs: Yes Stairs assistance: Modified independent (Device/Increase time) Stair Management: One rail  Left;Alternating pattern;Forwards Number of Stairs: 10    Wheelchair Mobility    Modified Rankin (Stroke Patients Only) Modified Rankin (Stroke Patients Only) Pre-Morbid Rankin Score: No symptoms Modified Rankin: No symptoms     Balance Overall balance assessment: Independent;History of Falls                           High level balance activites: Head turns;Side stepping High Level Balance Comments: independent             Pertinent Vitals/Pain Pain Assessment: No/denies pain    Home Living   Living Arrangements: Children (Dtr 50 yo.  and possibility of ex-husband )   Type of Home: House Home Access: Stairs to enter Entrance Stairs-Rails: Left Entrance Stairs-Number of Steps: 7-8 steps Home Layout: Two level Home Equipment: None      Prior Function Level of Independence: Independent         Comments: driving and runs errands on her own.  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     Hand Dominance   Dominant Hand: Right    Extremity/Trunk Assessment   Upper Extremity Assessment: Overall WFL for tasks assessed           Lower Extremity Assessment: Overall WFL for tasks assessed         Communication   Communication: No difficulties  Cognition Arousal/Alertness: Awake/alert Behavior During Therapy: WFL for tasks assessed/performed Overall Cognitive Status: Within Functional Limits for tasks assessed  General Comments      Exercises        Assessment/Plan    PT Assessment Patent does not need any further PT services  PT Diagnosis Generalized weakness   PT Problem List    PT Treatment Interventions     PT Goals (Current goals can be found in the Care Plan section) Acute Rehab PT Goals PT Goal Formulation: All assessment and education complete, DC therapy    Frequency     Barriers to discharge        Co-evaluation               End of Session Equipment  Utilized During Treatment: Gait belt Activity Tolerance: Patient tolerated treatment well Patient left: in bed;with call bell/phone within reach;with family/visitor present Nurse Communication: Mobility status         Time: 1610-9604 PT Time Calculation (min) (ACUTE ONLY): 18 min   Charges:   PT Evaluation $PT Eval Low Complexity: 1 Procedure     PT G Codes:       Gina Kline, PT, DPT X: [id number]

## 2015-11-10 NOTE — Discharge Instructions (Signed)
[facility] [phone]) to get a primary care provider.   Contact Medical Records to release medical records.   [phone]

## 2015-11-10 NOTE — ED Notes (Signed)
Report given to floor nurse, on Dept [address], all questions answered

## 2015-11-10 NOTE — H&P (Signed)
History and Physical    Gina Kline ZOX:[id number] DOB: 06/17/1965 DOA: 11/09/2015  PCP: No PCP Per Patient  Patient coming from: home  Chief Complaint:  Couldn't speak, right arm couldn't move  HPI: Gina Kline is a 50 y.o. female with medical history significant of anxiety comes in with sudden onset of aphasia and right arm numbness/tingling and weakness.  Pt says arounc 830pm she was with her exhusband helping move into a new house when she bent over to play with her dog.  When she got upright she suddenly could not speak, and her right arm/hand was heavy, numb, tingling and could not move.  She immediately thought she was having a stroke and wrote with her left hand to her ex "stroke".  At this time she was not experiencing any facial weakness/drooping/drooling/numbness/tingling or vision changes.  No headache.  Her ex husband called 911.  EMS arrived less than ten minutes later, she said by this time she was able to move her right arm and her speech was better.  They loaded her up and took her here to the ED.  Soon after arrival here she returned to normal, so total time of symptoms was about 15 minutes.  Pt denies any recent illnesses.  No recent stressors.  No fevers.  In the ED around 1020pm she again had sudden onset of aphasia but this time only aphasia with no other symptoms, this lasted 5-10 minutes before it completely resolved.  Pt is referred for admission for concerns of stroke.  She is anxious right now but back to normal neurologically.    Review of Systems: As per HPI otherwise 10 point review of systems negative.   Past Medical History:  Diagnosis Date  . Anxiety   . Depression   . Depression     Past Surgical History:  Procedure Laterality Date  . ABDOMINAL SURGERY    . tummy tuck       reports that she has quit smoking. She smoked 0.00 packs per day. She has never used smokeless tobacco. She reports that she does not drink alcohol or use  drugs.  Allergies  Allergen Reactions  . Vicodin [Hydrocodone-Acetaminophen]     Pt states that it makes her "feel weird"    No family history on file.  Prior to Admission medications   Medication Sig Start Date End Date Taking? Authorizing Provider  sertraline (ZOLOFT) 50 MG tablet Take 75 mg by mouth daily.   Yes Historical Provider, MD    Physical Exam: Vitals:   11/09/15 2300 11/10/15 0108 11/10/15 0109 11/10/15 0110  BP: 145/93 135/96  135/96  Pulse: 74   70  Resp: 17  18 16   Temp:    98.2 F (36.8 C)  TempSrc:    Oral  SpO2: 98%   99%  Weight:      Height:          Constitutional: NAD, calm, comfortable Vitals:   11/09/15 2300 11/10/15 0108 11/10/15 0109 11/10/15 0110  BP: 145/93 135/96  135/96  Pulse: 74   70  Resp: 17  18 16   Temp:    98.2 F (36.8 C)  TempSrc:    Oral  SpO2: 98%   99%  Weight:      Height:       Eyes: PERRL, lids and conjunctivae normal ENMT: Mucous membranes are moist. Posterior pharynx clear of any exudate or lesions.Normal dentition.  Neck: normal, supple, no masses, no thyromegaly Respiratory: clear to auscultation bilaterally,  no wheezing, no crackles. Normal respiratory effort. No accessory muscle use.  Cardiovascular: Regular rate and rhythm, no murmurs / rubs / gallops. No extremity edema. 2+ pedal pulses. No carotid bruits.  Abdomen: no tenderness, no masses palpated. No hepatosplenomegaly. Bowel sounds positive.  Musculoskeletal: no clubbing / cyanosis. No joint deformity upper and lower extremities. Good ROM, no contractures. Normal muscle tone.  Skin: no rashes, lesions, ulcers. No induration Neurologic: CN 2-12 grossly intact. Sensation intact, DTR normal. Strength 5/5 in all 4.  Psychiatric: Normal judgment and insight. Alert and oriented x 3. Normal mood.    Labs on Admission: I have personally reviewed following labs and imaging studies  CBC:  Recent Labs Lab 11/09/15 2100  WBC 6.5  HGB 13.8  HCT 40.8  MCV  92.7  PLT 291   Basic Metabolic Panel:  Recent Labs Lab 11/09/15 2100  NA 137  K 3.7  CL 107  CO2 27  GLUCOSE 101*  BUN 16  CREATININE 0.64  CALCIUM 8.9   GFR: Estimated Creatinine Clearance: 76.5 mL/min (by C-G formula based on SCr of 0.8 mg/dL). Liver Function Tests:  Recent Labs Lab 11/09/15 2100  AST 21  ALT 15  ALKPHOS 62  BILITOT 0.5  PROT 7.6  ALBUMIN 4.7   Cardiac Enzymes:  Recent Labs Lab 11/09/15 2100  TROPONINI <0.03   Urine analysis:    Component Value Date/Time   COLORURINE YELLOW 11/09/2015 2115   APPEARANCEUR CLEAR 11/09/2015 2115   LABSPEC 1.015 11/09/2015 2115   PHURINE 6.0 11/09/2015 2115   GLUCOSEU NEGATIVE 11/09/2015 2115   HGBUR TRACE (A) 11/09/2015 2115   BILIRUBINUR NEGATIVE 11/09/2015 2115   KETONESUR 15 (A) 11/09/2015 2115   PROTEINUR NEGATIVE 11/09/2015 2115   UROBILINOGEN 0.2 04/13/2011 1601   NITRITE NEGATIVE 11/09/2015 2115   LEUKOCYTESUR TRACE (A) 11/09/2015 2115    Radiological Exams on Admission: Ct Angio Head W Or Wo Contrast  Result Date: 11/10/2015 CLINICAL DATA:  Initial evaluation for acute onset word forming difficulty with right arm numbness, now resolved. EXAM: CT ANGIOGRAPHY HEAD AND NECK TECHNIQUE: Multidetector CT imaging of the head and neck was performed using the standard protocol during bolus administration of intravenous contrast. Multiplanar CT image reconstructions and MIPs were obtained to evaluate the vascular anatomy. Carotid stenosis measurements (when applicable) are obtained utilizing NASCET criteria, using the distal internal carotid diameter as the denominator. CONTRAST:  Not listed at time of this dictation. COMPARISON:  Prior noncontrast head CT from earlier the same day. FINDINGS: CTA NECK Aortic arch: Visualized aortic arch of normal caliber with normal 3 vessel morphology. No high-grade stenosis at the origin of the great vessels. Visualized subclavian arteries patent. Right carotid system: Right  common carotid artery patent from its origin to the bifurcation. Atheromatous irregularity about the right bifurcation/proximal right ICA the associated mild stenosis of approximately 20% by NASCET criteria. Right ICA well opacified distally without stenosis, dissection, or occlusion. Right external carotid artery and its branches within normal limits. Left carotid system: Left common carotid artery patent from its origin to the bifurcation. Short-segment atheromatous stenosis at the proximal left ICA of 40% by NASCET criteria. Distally, left ICA widely patent to the skullbase without stenosis, dissection, or occlusion. Left external carotid artery and its branches within normal limits. Vertebral arteries:Both vertebral arteries arise from the subclavian arteries. Vertebral arteries patent within the neck without stenosis, dissection, or occlusion. Skeleton: No acute osseous abnormality. No worrisome lytic or blastic osseous lesions. Mild multilevel degenerative spondylolysis within the  visualized cervical spine, greatest at C5-6. Other neck: Bilateral breast implants partially visualized. Visualized lungs are clear. Visualize mediastinum within normal limits. Thyroid gland normal. No adenopathy within the neck. No acute soft tissue abnormality. CTA HEAD Anterior circulation: Petrous, cavernous, and supraclinoid segments of the internal carotid arteries are widely patent without stenosis. Left A1 segment hypoplastic/absent. This likely accounts for the slightly diminutive left ICA is compared to the right. Right A1 segment widely patent. Anterior communicating artery normal. Anterior cerebral arteries opacified to their distal aspects. M1 segments patent without stenosis or occlusion. No proximal M2 occlusion. Distal MCA branches well opacified and symmetric. Posterior circulation: Vertebral arteries patent to the vertebrobasilar junction. V4 segments are mildly hypoplastic bilaterally. Posterior inferior cerebellar  arteries patent bilaterally. Basilar artery mildly diminutive but widely patent to its distal aspect. Superior cerebral arteries patent bilaterally. Right PCA arises from the basilar artery and is well opacified to its distal aspect. Fetal type left PCA supplied via a widely patent left posterior communicating artery. Left PCA also well supply to its distal aspect. Venous sinuses: Patent without evidence for venous sinus thrombosis. Anatomic variants: Hypoplastic/absent left A1 segment with the anterior cerebral arteries supplied via the right internal carotid artery system. Fetal type left PCA with somewhat diminutive vertebrobasilar system. No aneurysm or vascular malformation. Delayed phase: No pathologic enhancement. IMPRESSION: CTA NECK IMPRESSION: 1. Short-segment atheromatous stenosis at the proximal left ICA of approximately 40% by NASCET criteria. 2. Mild atheromatous irregularity with short-segment narrowing of up to 20% by NASCET criteria at the proximal right ICA. 3. Widely patent vertebral arteries within the neck. CTA HEAD IMPRESSION: Negative CTA of the head. No large or proximal arterial branch occlusion. No high-grade or correctable stenosis. No significant atheromatous disease within the intracranial circulation. Electronically Signed   By: Rise Mu M.D.   On: 11/10/2015 00:23   Dg Chest 2 View  Result Date: 11/09/2015 CLINICAL DATA:  Acute slurred speech and dizziness. EXAM: CHEST  2 VIEW COMPARISON:  07/11/2012 FINDINGS: The cardiomediastinal silhouette is unremarkable. There is no evidence of focal airspace disease, pulmonary edema, suspicious pulmonary nodule/mass, pleural effusion, or pneumothorax. No acute bony abnormalities are identified. IMPRESSION: No active cardiopulmonary disease. Electronically Signed   By: Harmon Pier M.D.   On: 11/09/2015 21:51   Ct Head Wo Contrast  Result Date: 11/09/2015 CLINICAL DATA:  50 year old female with acute aphasia and right arm  numbness. EXAM: CT HEAD WITHOUT CONTRAST TECHNIQUE: Contiguous axial images were obtained from the base of the skull through the vertex without intravenous contrast. COMPARISON:  None. FINDINGS: Brain: No evidence of infarction, hemorrhage, hydrocephalus, extra-axial collection or mass lesion/mass effect. Vascular: No hyperdense vessel or unexpected calcification. Skull: Unremarkable Sinuses/Orbits: Visualized portions unremarkable Other: None. IMPRESSION: Normal noncontrast head CT. Electronically Signed   By: Harmon Pier M.D.   On: 11/09/2015 21:45   Ct Angio Neck W And/or Wo Contrast  Result Date: 11/10/2015 CLINICAL DATA:  Initial evaluation for acute onset word forming difficulty with right arm numbness, now resolved. EXAM: CT ANGIOGRAPHY HEAD AND NECK TECHNIQUE: Multidetector CT imaging of the head and neck was performed using the standard protocol during bolus administration of intravenous contrast. Multiplanar CT image reconstructions and MIPs were obtained to evaluate the vascular anatomy. Carotid stenosis measurements (when applicable) are obtained utilizing NASCET criteria, using the distal internal carotid diameter as the denominator. CONTRAST:  Not listed at time of this dictation. COMPARISON:  Prior noncontrast head CT from earlier the same day. FINDINGS: CTA NECK  Aortic arch: Visualized aortic arch of normal caliber with normal 3 vessel morphology. No high-grade stenosis at the origin of the great vessels. Visualized subclavian arteries patent. Right carotid system: Right common carotid artery patent from its origin to the bifurcation. Atheromatous irregularity about the right bifurcation/proximal right ICA the associated mild stenosis of approximately 20% by NASCET criteria. Right ICA well opacified distally without stenosis, dissection, or occlusion. Right external carotid artery and its branches within normal limits. Left carotid system: Left common carotid artery patent from its origin to the  bifurcation. Short-segment atheromatous stenosis at the proximal left ICA of 40% by NASCET criteria. Distally, left ICA widely patent to the skullbase without stenosis, dissection, or occlusion. Left external carotid artery and its branches within normal limits. Vertebral arteries:Both vertebral arteries arise from the subclavian arteries. Vertebral arteries patent within the neck without stenosis, dissection, or occlusion. Skeleton: No acute osseous abnormality. No worrisome lytic or blastic osseous lesions. Mild multilevel degenerative spondylolysis within the visualized cervical spine, greatest at C5-6. Other neck: Bilateral breast implants partially visualized. Visualized lungs are clear. Visualize mediastinum within normal limits. Thyroid gland normal. No adenopathy within the neck. No acute soft tissue abnormality. CTA HEAD Anterior circulation: Petrous, cavernous, and supraclinoid segments of the internal carotid arteries are widely patent without stenosis. Left A1 segment hypoplastic/absent. This likely accounts for the slightly diminutive left ICA is compared to the right. Right A1 segment widely patent. Anterior communicating artery normal. Anterior cerebral arteries opacified to their distal aspects. M1 segments patent without stenosis or occlusion. No proximal M2 occlusion. Distal MCA branches well opacified and symmetric. Posterior circulation: Vertebral arteries patent to the vertebrobasilar junction. V4 segments are mildly hypoplastic bilaterally. Posterior inferior cerebellar arteries patent bilaterally. Basilar artery mildly diminutive but widely patent to its distal aspect. Superior cerebral arteries patent bilaterally. Right PCA arises from the basilar artery and is well opacified to its distal aspect. Fetal type left PCA supplied via a widely patent left posterior communicating artery. Left PCA also well supply to its distal aspect. Venous sinuses: Patent without evidence for venous sinus  thrombosis. Anatomic variants: Hypoplastic/absent left A1 segment with the anterior cerebral arteries supplied via the right internal carotid artery system. Fetal type left PCA with somewhat diminutive vertebrobasilar system. No aneurysm or vascular malformation. Delayed phase: No pathologic enhancement. IMPRESSION: CTA NECK IMPRESSION: 1. Short-segment atheromatous stenosis at the proximal left ICA of approximately 40% by NASCET criteria. 2. Mild atheromatous irregularity with short-segment narrowing of up to 20% by NASCET criteria at the proximal right ICA. 3. Widely patent vertebral arteries within the neck. CTA HEAD IMPRESSION: Negative CTA of the head. No large or proximal arterial branch occlusion. No high-grade or correctable stenosis. No significant atheromatous disease within the intracranial circulation. Electronically Signed   By: Rise Mu M.D.   On: 11/10/2015 00:23    EKG: Independently reviewed. nsr  Assessment/Plan 50 yo healthy female with symptoms concerning for TIA/CVA  Principal Problem:   TIA (transient ischemic attack)/CVA - neuro dr Amada Jupiter was called at [facility], advised to give plavix and aspirin until clearer what is happening.  cta shows a 40% stenosis in her proximal left ICA, no critical stenosis anywhere.  Obtain mri brain in the am and cardiac echo.  Check FLP in the am.    Active Problems:   Aphasia- as above   Right arm weakness- as above   Hypertension- allow permissive high readings for now, no previous h/o htn   Pt has no health insurance and  no PCP, sees [facility] for her psych med.  Will need CM consult to help out with this.  obs on tele.    DVT prophylaxis: scds  Code Status: full Family Communication: none Disposition Plan: per day team   Jill Ruppe A MD [facility]  If 7PM-7AM, please contact night-coverage [email or web address] Password [email or web address]  11/10/2015, 1:19 AM

## 2015-11-11 LAB — RHEUMATOID FACTOR: Rhuematoid fact SerPl-aCnc: <10 IU/mL (ref 0.0–13.9)

## 2015-11-12 LAB — HEMOGLOBIN A1C
Hgb A1c MFr Bld: 5.4 % (ref 4.8–5.6)
Mean Plasma Glucose: 108 mg/dL

## 2015-11-12 LAB — ANTINUCLEAR ANTIBODIES, IFA: ANA Ab, IFA: NEGATIVE

## 2019-03-28 ENCOUNTER — Ambulatory Visit
Admission: RE | Admit: 2019-03-28 | Discharge: 2019-03-28 | Disposition: A | Discharge status: Home / Self Care | Payer: No Typology Code available for payment source | Source: Ambulatory Visit | Attending: *Deleted | Admitting: *Deleted

## 2019-03-28 ENCOUNTER — Other Ambulatory Visit: Payer: Self-pay | Admitting: *Deleted

## 2019-03-28 DIAGNOSIS — Z9289 Personal history of other medical treatment: Secondary | ICD-10-CM

## 2019-11-10 ENCOUNTER — Other Ambulatory Visit (HOSPITAL_COMMUNITY): Payer: Self-pay | Admitting: Internal Medicine

## 2019-11-10 DIAGNOSIS — Z1231 Encounter for screening mammogram for malignant neoplasm of breast: Secondary | ICD-10-CM

## 2019-11-16 ENCOUNTER — Encounter: Payer: Self-pay | Admitting: *Deleted

## 2019-11-27 ENCOUNTER — Ambulatory Visit (HOSPITAL_COMMUNITY): Payer: Self-pay

## 2020-01-10 ENCOUNTER — Ambulatory Visit: Payer: Self-pay

## 2020-06-27 ENCOUNTER — Ambulatory Visit (INDEPENDENT_AMBULATORY_CARE_PROVIDER_SITE_OTHER): Payer: No Typology Code available for payment source | Admitting: Diagnostic Neuroimaging

## 2020-06-27 ENCOUNTER — Encounter: Payer: Self-pay | Admitting: *Deleted

## 2020-06-27 VITALS — BP 148/98 | HR 83 | Ht 62.0 in | Wt 137.3 lb

## 2020-06-27 DIAGNOSIS — I63412 Cerebral infarction due to embolism of left middle cerebral artery: Secondary | ICD-10-CM | POA: Diagnosis not present

## 2020-06-27 NOTE — Patient Instructions (Signed)
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, unknown source) - follow up and complete stroke workup - check TTE bubble and CTA neck - then may need to consider TEE and implanted loop recorder (cryptogenic stroke; embolic stroke unknown source --> ESUS) - continue aspirin 81mg  daily - follow up PCP for BP control and lipid, A1c testing   PREOPERATIVE STROKE RISK STRATIFICATION - re: anticipated elective surgery, I would recommend to complete above stroke workup and optimize medical mgmt of risk factors first; then when surgery is planned, could hold aspirin for 5-7 days before procedure and restart as soon as possible afterwards; no stroke concern for subcutaneous lidocaine + epinephrine as queried by surgeon Dr. 

## 2020-06-27 NOTE — Progress Notes (Signed)
[facility]  PATIENT: Gina Kline DOB: 11/04/1965  REFERRING CLINICIAN: Etter Sjogren, MD HISTORY FROM: patient  REASON FOR VISIT: new consult    HISTORICAL  CHIEF COMPLAINT:  Chief Complaint  Patient presents with  . New Patient (Initial Visit)    Rm 6 alone Reports in 2017 she had a stroke and has been advised to seek neuro consult per plastic surgeon for clearance for upcoming procedure. Pt reports neuro consult was not completed back in 2017 when her stroke to place. Pt is scheduled on 07/05/20 for her face lift.     HISTORY OF PRESENT ILLNESS:   55 year old female here for evaluation of preoperative evaluation of stroke risk.  2017 patient had onset of word finding difficulties and right arm weakness.  Patient went to the hospital for evaluation was diagnosed with acute stroke.  Symptoms resolved by the time she presented to the hospital but she had brief recurrence of symptoms after coming to the hospital.  She had CT angiogram, MRI, echocardiogram and lab testing.  She was found to have 40 to 50% stenosis of the left internal carotid artery proximally.  She was a daily smoker from age 61 to 89 years old, having stopped just a few years before the stroke.  No other specific risk factors were found.  She was started on aspirin and atorvastatin.  She was supposed to follow-up with neurology but she was afraid of further evaluation and testing.  Since that time patient has done well.  No recurrent stroke symptoms.  She is continued on aspirin 81 mg daily.  She stopped her statin because of leg pain.  Her blood pressure has been slightly higher in the last few weeks.  Patient has family history of stroke in her father and grandparents, but not at a young age.  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   REVIEW OF SYSTEMS: Full 14 system review of systems performed and negative with exception of: as  per HPI.  ALLERGIES: Allergies  Allergen Reactions  . Atorvastatin     Muscle soreness   . Vicodin [Hydrocodone-Acetaminophen]     Pt states that it makes her "feel weird"    HOME MEDICATIONS: Outpatient Medications Prior to Visit  Medication Sig Dispense Refill  . aspirin 81 MG tablet Take 1 tablet (81 mg total) by mouth daily.    . sertraline (ZOLOFT) 50 MG tablet Take 75 mg by mouth daily.    Marland Kitchen atorvastatin (LIPITOR) 80 MG tablet Take 1 tablet (80 mg total) by mouth daily at 6 PM. (Patient not taking: Reported on 06/27/2020) 30 tablet 1   No facility-administered medications prior to visit.    PAST MEDICAL HISTORY: Past Medical History:  Diagnosis Date  . Anxiety   . Depression   . Stroke [facility]) 2017    PAST SURGICAL HISTORY: Past Surgical History:  Procedure Laterality Date  . ABDOMINAL SURGERY    . tummy tuck      FAMILY HISTORY: Family History  Problem Relation Age of Onset  . Diabetes Mother   . Stomach cancer Mother   . Stroke Father   . Stroke Maternal Grandmother   . Stroke Paternal Grandmother     SOCIAL HISTORY: Social History   Socioeconomic History  . Marital status: Divorced    Spouse name: Not on file  . Number of children: Not on file  . Years of education: Not on  file  . Highest education level: Not on file  Occupational History  . Not on file  Tobacco Use  . Smoking status: Former Smoker    Packs/day: 0.00  . Smokeless tobacco: Never Used  Substance and Sexual Activity  . Alcohol use: No  . Drug use: No    Comment: used cocaine 06/17/2012  . Sexual activity: Yes    Birth control/protection: None  Other Topics Concern  . Not on file  Social History Narrative   Right handed   Social Determinants of Health   Financial Resource Strain: Not on file  Food Insecurity: Not on file  Transportation Needs: Not on file  Physical Activity: Not on file  Stress: Not on file  Social Connections: Not on file  Intimate Partner Violence:  Not on file     PHYSICAL EXAM  GENERAL EXAM/CONSTITUTIONAL: Vitals:  Vitals:   06/27/20 1302  BP: (!) 148/98  Pulse: 83  Weight: 137 lb 5 oz (62.3 kg)  Height: 5\' 2"  (1.575 m)     Body mass index is 25.11 kg/m. Wt Readings from Last 3 Encounters:  06/27/20 137 lb 5 oz (62.3 kg)  11/10/15 137 lb 11.2 oz (62.5 kg)  08/28/15 138 lb 3.2 oz (62.7 kg)     Patient is in no distress; well developed, nourished and groomed; neck is supple  CARDIOVASCULAR:  Examination of carotid arteries is normal; no carotid bruits  Regular rate and rhythm, no murmurs  Examination of peripheral vascular system by observation and palpation is normal  EYES:  Ophthalmoscopic exam of optic discs and posterior segments is normal; no papilledema or hemorrhages  No exam data present  MUSCULOSKELETAL:  Gait, strength, tone, movements noted in Neurologic exam below  NEUROLOGIC: MENTAL STATUS:  No flowsheet data found.  awake, alert, oriented to person, place and time  recent and remote memory intact  normal attention and concentration  language fluent, comprehension intact, naming intact  fund of knowledge appropriate  CRANIAL NERVE:   2nd - no papilledema on fundoscopic exam  2nd, 3rd, 4th, 6th - pupils equal and reactive to light, visual fields full to confrontation, extraocular muscles intact, no nystagmus  5th - facial sensation symmetric  7th - facial strength symmetric  8th - hearing intact  9th - palate elevates symmetrically, uvula midline  11th - shoulder shrug symmetric  12th - tongue protrusion midline  MOTOR:   normal bulk and tone, full strength in the BUE, BLE  SENSORY:   normal and symmetric to light touch, temperature, vibration  COORDINATION:   finger-nose-finger, fine finger movements normal  REFLEXES:   deep tendon reflexes present and symmetric  GAIT/STATION:   narrow based gait     DIAGNOSTIC DATA (LABS, IMAGING, TESTING) - I  reviewed patient records, labs, notes, testing and imaging myself where available.  Lab Results  Component Value Date   WBC 6.5 11/09/2015   HGB 13.8 11/09/2015   HCT 40.8 11/09/2015   MCV 92.7 11/09/2015   PLT 291 11/09/2015      Component Value Date/Time   NA 137 11/09/2015 2100   K 3.7 11/09/2015 2100   CL 107 11/09/2015 2100   CO2 27 11/09/2015 2100   GLUCOSE 101 (H) 11/09/2015 2100   BUN 16 11/09/2015 2100   CREATININE 0.64 11/09/2015 2100   CALCIUM 8.9 11/09/2015 2100   PROT 7.6 11/09/2015 2100   ALBUMIN 4.7 11/09/2015 2100   AST 21 11/09/2015 2100   ALT 15 11/09/2015 2100   ALKPHOS  62 11/09/2015 2100   BILITOT 0.5 11/09/2015 2100   GFRNONAA >60 11/09/2015 2100   GFRAA >60 11/09/2015 2100   Lab Results  Component Value Date   CHOL 196 11/10/2015   HDL 62 11/10/2015   LDLCALC 115 (H) 11/10/2015   TRIG 93 11/10/2015   CHOLHDL 3.2 11/10/2015   Lab Results  Component Value Date   HGBA1C 5.4 11/10/2015   No results found for: VITAMINB12 No results found for: TSH   11/10/15  TTE - LVEF 60-65%, mild LVH, normal wall motion, borderline diastolic  dysfunction, systolic bowing and trivial MR, normal LA size,  mobile interatrial septum (consider bubble study to evaluate for  PFO), trivial TR, RVSP 17 mmHg, normal IVC.    11/09/15 CTA NECK IMPRESSION:[I reviewed images myself and agree with interpretation. Left ICA stenosis is irregular could be atherosclerotic or dissection flap. -VRP]  1. Short-segment atheromatous stenosis at the proximal left ICA of approximately 40% by NASCET criteria. 2. Mild atheromatous irregularity with short-segment narrowing of up to 20% by NASCET criteria at the proximal right ICA. 3. Widely patent vertebral arteries within the neck.  11/09/15 CTA HEAD IMPRESSION: - Negative CTA of the head. No large or proximal arterial branch occlusion. No high-grade or correctable stenosis. No significant atheromatous disease within the  intracranial circulation.   11/10/15 MRI brain [I reviewed images myself and agree with interpretation. -VRP]  1. 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 2. Mild cerebral white matter disease, abnormal for age and nonspecific. This may reflect chronic small vessel ischemia, with other considerations including sequelae of trauma, hypercoagulable state, vasculitis, migraines, prior infection, and less likely demyelination.    ASSESSMENT AND PLAN  55 y.o. year old female here with:  Dx:  1. Cerebrovascular accident (CVA) due to embolism of left middle cerebral artery (HCC)       PLAN:  LEFT FRONTAL STROKE (2017, at age 85 years old, possible embolic, unknown source; had mild left ICA stenosis in past CTA) - follow up and complete stroke workup - check TTE bubble and repeat CTA neck - then may need to consider TEE and implanted loop recorder (cryptogenic stroke; embolic stroke unknown source --> ESUS) - continue aspirin 81mg  daily - follow up PCP for BP control and lipid, A1c testing   PREOPERATIVE STROKE RISK STRATIFICATION (low risk) - re: anticipated elective surgery, I would recommend to complete above stroke workup and optimize medical mgmt of risk factors first; then when surgery is planned, could hold aspirin for 3 days before procedure and restart as soon as possible afterwards; no stroke concern for local anesthesia (subcutaneous xylocaine + epinephrine) as queried by surgeon Dr.  Orders Placed This Encounter  Procedures  . CT ANGIO NECK W OR WO CONTRAST  . ECHOCARDIOGRAM COMPLETE BUBBLE STUDY   Return pending testing, for pending if symptoms worsen or fail to improve.    Odis Luster, MD 06/27/2020, 1:15 PM Certified in Neurology, Neurophysiology and Neuroimaging  [facility] [address], [address] [place], [place] [phone]

## 2020-07-02 ENCOUNTER — Telehealth: Payer: Self-pay | Admitting: Diagnostic Neuroimaging

## 2020-07-02 NOTE — Telephone Encounter (Signed)
[facility] auth: NPR ref # [id number] on 07/02/20 order sent to GI. They will reach out to the patient to schedule.

## 2020-07-04 ENCOUNTER — Ambulatory Visit
Admission: RE | Admit: 2020-07-04 | Discharge: 2020-07-04 | Disposition: A | Discharge status: Home / Self Care | Payer: No Typology Code available for payment source | Source: Ambulatory Visit | Attending: Diagnostic Neuroimaging | Admitting: Diagnostic Neuroimaging

## 2020-07-04 ENCOUNTER — Other Ambulatory Visit: Payer: Self-pay

## 2020-07-04 DIAGNOSIS — I63412 Cerebral infarction due to embolism of left middle cerebral artery: Secondary | ICD-10-CM

## 2020-07-04 MED ORDER — IOPAMIDOL (ISOVUE-370) INJECTION 76%
75.0000 mL | Freq: Once | INTRAVENOUS | Status: AC | PRN
  Administered 2020-07-04: 75 mL via INTRAVENOUS

## 2020-07-09 ENCOUNTER — Telehealth: Payer: Self-pay

## 2020-07-09 NOTE — Telephone Encounter (Signed)
Patient returned my call.  I discussed her unremarkable MRI results.  Patient inquired about when her bubble study will be scheduled.  I will check into this.  Patient verbalized understanding of results.  I called [facility], spoke with Misty Stanley.  She will reach out to patient to schedule the bubble study.  Misty Stanley can be reached at [phone].

## 2020-07-09 NOTE — Telephone Encounter (Signed)
I called patient to discuss.  No answer, left a voicemail asking her to call us back. 

## 2020-07-09 NOTE — Telephone Encounter (Signed)
-----   Message from Suanne Marker, MD sent at 07/08/2020  4:08 PM EDT ----- Unremarkable imaging results. Please call patient. Continue current plan. -VRP

## 2020-07-22 ENCOUNTER — Other Ambulatory Visit (HOSPITAL_COMMUNITY): Payer: No Typology Code available for payment source

## 2020-07-23 ENCOUNTER — Other Ambulatory Visit: Payer: Self-pay

## 2020-07-23 ENCOUNTER — Ambulatory Visit (HOSPITAL_COMMUNITY)
Admission: RE | Admit: 2020-07-23 | Discharge: 2020-07-23 | Disposition: A | Discharge status: Home / Self Care | Payer: No Typology Code available for payment source | Source: Ambulatory Visit | Attending: Diagnostic Neuroimaging | Admitting: Diagnostic Neuroimaging

## 2020-07-23 DIAGNOSIS — I63412 Cerebral infarction due to embolism of left middle cerebral artery: Secondary | ICD-10-CM | POA: Diagnosis present

## 2020-07-23 DIAGNOSIS — I517 Cardiomegaly: Secondary | ICD-10-CM | POA: Insufficient documentation

## 2020-07-24 LAB — ECHOCARDIOGRAM COMPLETE BUBBLE STUDY
Area-P 1/2: 4.05 cm2
S' Lateral: 2.9 cm

## 2020-07-31 ENCOUNTER — Telehealth: Payer: Self-pay | Admitting: *Deleted

## 2020-07-31 NOTE — Telephone Encounter (Signed)
-----   Message from Suanne Marker, MD sent at 07/30/2020  8:00 PM EDT ----- Unremarkable study. No major findings. Please call patient. Continue current plan. -VRP

## 2020-07-31 NOTE — Telephone Encounter (Signed)
Called and spoke with pt about results per Dr. Richrd Humbles note. Pt verbalized understanding and appreciation.

## 2020-08-29 ENCOUNTER — Telehealth: Payer: Self-pay | Admitting: *Deleted

## 2020-08-29 NOTE — Telephone Encounter (Signed)
Pt returned phone call, would like a call back.  

## 2020-08-29 NOTE — Telephone Encounter (Signed)
Received surgery clearance letter from [facility]. Called patient, LVM requested call back re: has she FU with PCP per Dr Richrd Humbles note.

## 2020-08-29 NOTE — Telephone Encounter (Signed)
Called patient who stated she saw PCP, had lipid panel which showed high LDL. She is on simvastatin 80 mg daily. She is also now on losartan 25 mg daily and is taking ASA 81 mg. I advised Dr Marjory Lies will be back in office on Monday and will get clearance letter completed and signed.  She expressed concern as to why letter was sent, stated Dr Odis Luster nurse told her everything was ready to schedule. Per letter from [facility] plastic surgery her surgery is on 12/20/20. I assured her there is plenty of time to get letter back to surgeon. She stated she didn't understand why surgery was scheduled if everything wasn't ready. I advised her she may call surgeons office with her question.  I told her I'll call her when it has been faxed. She can then follow up with Dr Odis Luster. Patient verbalized understanding, appreciation.

## 2020-09-02 NOTE — Telephone Encounter (Signed)
Dr Odis Luster surgical clearance form and [facility] letter signed, faxed to Dr Odis Luster. Received confirmation. Called patient and LVM to inform her.

## 2020-09-02 NOTE — Telephone Encounter (Signed)
Per Dr Marjory Lies, standard periop management letter composed, ready for review, signature with surgical clearance letter from Dr Odis Luster on MD desk for completion, signature.

## 2023-08-05 ENCOUNTER — Other Ambulatory Visit (HOSPITAL_COMMUNITY): Payer: Self-pay | Admitting: Nurse Practitioner

## 2023-08-05 DIAGNOSIS — Z1231 Encounter for screening mammogram for malignant neoplasm of breast: Secondary | ICD-10-CM

## 2023-08-24 LAB — COLOGUARD: COLOGUARD: NEGATIVE

## 2024-04-10 ENCOUNTER — Other Ambulatory Visit (HOSPITAL_COMMUNITY): Payer: Self-pay | Admitting: Nurse Practitioner

## 2024-04-10 DIAGNOSIS — Z111 Encounter for screening for respiratory tuberculosis: Secondary | ICD-10-CM
